# Patient Record
Sex: Female | Born: 1940 | Race: White | Hispanic: No | Smoking: Former smoker
Health system: Southern US, Community
[De-identification: ages and names within clinical notes are randomized; demographics above are authoritative.]

## PROBLEM LIST (undated history)

## (undated) DIAGNOSIS — K219 Gastro-esophageal reflux disease without esophagitis: Secondary | ICD-10-CM

## (undated) DIAGNOSIS — I10 Essential (primary) hypertension: Secondary | ICD-10-CM

## (undated) DIAGNOSIS — E119 Type 2 diabetes mellitus without complications: Secondary | ICD-10-CM

## (undated) DIAGNOSIS — F329 Major depressive disorder, single episode, unspecified: Secondary | ICD-10-CM

## (undated) DIAGNOSIS — F419 Anxiety disorder, unspecified: Secondary | ICD-10-CM

## (undated) DIAGNOSIS — F32A Depression, unspecified: Secondary | ICD-10-CM

## (undated) DIAGNOSIS — E785 Hyperlipidemia, unspecified: Secondary | ICD-10-CM

## (undated) DIAGNOSIS — R32 Unspecified urinary incontinence: Secondary | ICD-10-CM

## (undated) HISTORY — DX: Hyperlipidemia, unspecified: E78.5

## (undated) HISTORY — DX: Unspecified urinary incontinence: R32

## (undated) HISTORY — DX: Essential (primary) hypertension: I10

## (undated) HISTORY — DX: Depression, unspecified: F32.A

## (undated) HISTORY — DX: Anxiety disorder, unspecified: F41.9

## (undated) HISTORY — PX: NECK SURGERY: SHX720

## (undated) HISTORY — DX: Gastro-esophageal reflux disease without esophagitis: K21.9

## (undated) HISTORY — DX: Major depressive disorder, single episode, unspecified: F32.9

## (undated) HISTORY — DX: Type 2 diabetes mellitus without complications: E11.9

---

## 2018-02-14 ENCOUNTER — Ambulatory Visit: Payer: Self-pay | Admitting: Medical

## 2018-02-20 ENCOUNTER — Encounter: Payer: Self-pay | Admitting: Medical

## 2018-02-20 ENCOUNTER — Ambulatory Visit (INDEPENDENT_AMBULATORY_CARE_PROVIDER_SITE_OTHER): Payer: Medicare Other | Admitting: Medical

## 2018-02-20 VITALS — BP 138/52 | HR 91 | Temp 97.6°F | Resp 16 | Ht 61.0 in | Wt 171.2 lb

## 2018-02-20 DIAGNOSIS — E119 Type 2 diabetes mellitus without complications: Secondary | ICD-10-CM

## 2018-02-20 DIAGNOSIS — M797 Fibromyalgia: Secondary | ICD-10-CM

## 2018-02-20 DIAGNOSIS — G8929 Other chronic pain: Secondary | ICD-10-CM

## 2018-02-20 DIAGNOSIS — E785 Hyperlipidemia, unspecified: Secondary | ICD-10-CM

## 2018-02-20 DIAGNOSIS — I10 Essential (primary) hypertension: Secondary | ICD-10-CM

## 2018-02-20 DIAGNOSIS — M25562 Pain in left knee: Secondary | ICD-10-CM

## 2018-02-20 DIAGNOSIS — M545 Low back pain: Secondary | ICD-10-CM

## 2018-02-20 DIAGNOSIS — M25552 Pain in left hip: Secondary | ICD-10-CM | POA: Diagnosis not present

## 2018-02-20 DIAGNOSIS — G629 Polyneuropathy, unspecified: Secondary | ICD-10-CM

## 2018-02-20 DIAGNOSIS — F329 Major depressive disorder, single episode, unspecified: Secondary | ICD-10-CM

## 2018-02-20 DIAGNOSIS — F32A Depression, unspecified: Secondary | ICD-10-CM

## 2018-02-20 DIAGNOSIS — R5383 Other fatigue: Secondary | ICD-10-CM

## 2018-02-20 DIAGNOSIS — M2550 Pain in unspecified joint: Secondary | ICD-10-CM

## 2018-02-20 DIAGNOSIS — F419 Anxiety disorder, unspecified: Secondary | ICD-10-CM

## 2018-02-20 MED ORDER — GABAPENTIN 800 MG PO TABS: 800.0000 mg | ORAL_TABLET | Freq: Every day | ORAL | 3 refills | Status: DC

## 2018-02-20 MED ORDER — CLONAZEPAM 0.5 MG PO TABS: 0.5000 mg | ORAL_TABLET | Freq: Every day | ORAL | 0 refills | Status: DC

## 2018-02-20 NOTE — Patient Instructions (Addendum)
For left knee pain that is chronic will get xray of left knee. Then decide on level of medication needed.  For left hip pain will also get left hip xray. Med that will prescribe to be determined pending xray review.  For low back pain will xray.  For neuropathy, Will prescribe your own gabapentin rx. Stop sister meds.  For depression continue effexor. You have list of specialist. Call and schedule. If you need referral please let us know. If you have severe depression, anxiety, thoughts of harm to self or others then ED evaluation at [facility] long.  For anxiety, klonopin rx for 2 weeks. Discussed control medication policy and rules. For neuropathy continue gabapentin. I rx 800 mg to take at night.  bp controlled. Continue current bp med.  For high cholesterol continue current stain.  Flor diabetes continue metformin,  For fatigue and chronic med probems will get listed labs on this sheet.  Follow up in 2 weeks or as needed.  Sign release form so we can get old records.

## 2018-02-20 NOTE — Progress Notes (Signed)
Subjective:    Patient ID: Crystal Mckenzie, female    DOB: 12-21-1940, 78 y.o.   MRN: [id number]  HPI Pt in today for follow up.  She states has new insurance so needs to get new pcp.  Pt used to smoke. She stopped smoking around 78 yrs old. Started around 2 packs a day. No alcohol. Pt Jehovah witness.  Pt states today her left knee is hurting.  This is chronic pain.  Pt has known neuropathy of lower ext. She in on high dose gabapentin. Some pain that is in hip and runs down her leg often. No xray of left hip in the past. Patient had been on gabapentin in the past and she instead uses higher dose of 800 mg at night. Her old rx was 300 mg tid.  Pt is on effexor for depression. Pt is on effexor for a couple of years. Pt has some different meds in past but she not sure which ones. Pt also has some anxiety. She feels like may need to see psychiatrist at that timestates ready to go see specialist). Pt states middle son overdosed in past. Oldest son passed away from throat cancer. Sister has liver cancer.   Pt states occasionally has old clonazepam rx from sister. Pt has been taking klonopin 0.5 mg nightly.  Pt has hx of htn. Bp is 138/82.  Pt has diabetes type II. She is on metformin.  She also has high cholesterol and is on atorvastatin.     Review of Systems  Constitutional: Negative for chills, fatigue and fever.  Eyes: Negative for pain.  Respiratory: Negative for cough, chest tightness, shortness of breath and wheezing.   Cardiovascular: Negative for chest pain and palpitations.  Gastrointestinal: Negative for abdominal pain, blood in stool, constipation, diarrhea and nausea.  Genitourinary: Negative for dysuria, enuresis, frequency and hematuria.  Musculoskeletal: Positive for back pain. Negative for gait problem.       Knee pain and hip pain. Fibromyalgia as well.  Skin: Negative for rash.  Neurological: Negative for dizziness, syncope, speech difficulty, weakness,  light-headedness and headaches.       Neuropathy.  Hematological: Negative for adenopathy. Does not bruise/bleed easily.  Psychiatric/Behavioral: Positive for dysphoric mood. Negative for behavioral problems, confusion, sleep disturbance and suicidal ideas. The patient is nervous/anxious.     Past Medical History:  Diagnosis Date  . Depression   . Diabetes mellitus without complication (HCC)   . GERD (gastroesophageal reflux disease)   . Hyperlipidemia   . Hypertension   . Urine incontinence      Social History   Socioeconomic History  . Marital status: Widowed    Spouse name: Not on file  . Number of children: Not on file  . Years of education: Not on file  . Highest education level: Not on file  Occupational History  . Not on file  Social Needs  . Financial resource strain: Not on file  . Food insecurity:    Worry: Not on file    Inability: Not on file  . Transportation needs:    Medical: Not on file    Non-medical: Not on file  Tobacco Use  . Smoking status: Not on file  Substance and Sexual Activity  . Alcohol use: Not on file  . Drug use: Not on file  . Sexual activity: Not on file  Lifestyle  . Physical activity:    Days per week: Not on file    Minutes per session: Not on file  .  Stress: Not on file  Relationships  . Social connections:    Talks on phone: Not on file    Gets together: Not on file    Attends religious service: Not on file    Active member of club or organization: Not on file    Attends meetings of clubs or organizations: Not on file    Relationship status: Not on file  . Intimate partner violence:    Fear of current or ex partner: Not on file    Emotionally abused: Not on file    Physically abused: Not on file    Forced sexual activity: Not on file  Other Topics Concern  . Not on file  Social History Narrative  . Not on file     No family history on file.  Allergies  Allergen Reactions  . Penicillins     Current Outpatient  Medications on File Prior to Visit  Medication Sig Dispense Refill  . atorvastatin (LIPITOR) 40 MG tablet Take 40 mg by mouth daily.    . Cholecalciferol (D3 VITAMIN PO) Take 2,000 Units by mouth.    . clonazePAM (KLONOPIN) 0.5 MG tablet Take 0.5 mg by mouth daily.    Marland Kitchen. gabapentin (NEURONTIN) 300 MG capsule Take 300 mg by mouth 3 (three) times daily.    Marland Kitchen. gabapentin (NEURONTIN) 800 MG tablet Take 800 mg by mouth at bedtime.    Marland Kitchen. lisinopril (PRINIVIL,ZESTRIL) 20 MG tablet Take 20 mg by mouth daily.    . Magnesium Citrate 125 MG CAPS Take by mouth.    . metFORMIN (GLUCOPHAGE) 500 MG tablet Take by mouth 3 (three) times daily.    . metoprolol tartrate (LOPRESSOR) 50 MG tablet Take 50 mg by mouth 2 (two) times daily.    Marland Kitchen. omeprazole (PRILOSEC) 40 MG capsule Take 40 mg by mouth daily.    Marland Kitchen. venlafaxine XR (EFFEXOR-XR) 150 MG 24 hr capsule Take 300 mg by mouth daily with breakfast.     No current facility-administered medications on file prior to visit.     BP (!) 138/52   Pulse 91   Temp 97.6 F (36.4 C) (Oral)   Resp 16   Ht 5\' 1"  (1.549 m)   Wt 171 lb 3.2 oz (77.7 kg)   SpO2 97%   BMI 32.35 kg/m       Objective:   Physical Exam  General Mental Status- Alert. General Appearance- Not in acute distress.   Skin General: Color- Normal Color. Moisture- Normal Moisture.  Neck Carotid Arteries- Normal color. Moisture- Normal Moisture. No carotid bruits. No JVD.  Chest and Lung Exam Auscultation: Breath Sounds:-Normal.  Cardiovascular Auscultation:Rythm- Regular. Murmurs & Other Heart Sounds:Auscultation of the heart reveals- No Murmurs.  Abdomen Inspection:-Inspeection Normal. Palpation/Percussion:Note:No mass. Palpation and Percussion of the abdomen reveal- Non Tender, Non Distended + BS, no rebound or guarding.   Neurologic Cranial Nerve exam:- CN III-XII intact(No nystagmus), symmetric smile. Strength:- 5/5 equal and symmetric strength both upper and lower  extremities.  Left hip- pain on palpation and rom.      Assessment & Plan:   For left knee pain that is chronic will get xray of left knee. Then decide on level of medication needed.  For left hip pain will also get left hip xray. Med that will prescribe to be determined pending xray review.  For back pain will get xray.  For neuropathy, Will prescribe your own gabapentin rx. Stop sister meds.  For depression continue effexor. You have list of specialist.  Call and schedule. If you need referral please let us know. If you have severe depression, anxiety, thoughts of harm to self or others then ED evaluation at [facility] long.  For anxiety, klonopin rx for 2 weeks. Discussed control medication policy and rules. For neuropathy continue gabapentin. I rx 800 mg to take at night.  bp controlled. Continue current bp med.  For high cholesterol continue current stain.  Flor diabetes continue metformin,  For fatigue and chronic med probems will get listed labs on this sheet.  Follow up in 2 weeks or as needed.  Sign release form so we can get old records.  45 minuts spent with pt 50% of time spent reviewing and explaining/counseling pt on each chronic condition and plan going forward.

## 2018-02-21 ENCOUNTER — Telehealth: Payer: Self-pay | Admitting: Medical

## 2018-02-21 ENCOUNTER — Other Ambulatory Visit (INDEPENDENT_AMBULATORY_CARE_PROVIDER_SITE_OTHER): Payer: Medicare Other

## 2018-02-21 ENCOUNTER — Ambulatory Visit (HOSPITAL_BASED_OUTPATIENT_CLINIC_OR_DEPARTMENT_OTHER)
Admission: RE | Admit: 2018-02-21 | Discharge: 2018-02-21 | Disposition: A | Discharge status: Home / Self Care | Payer: Medicare Other | Source: Ambulatory Visit | Attending: Medical | Admitting: Medical

## 2018-02-21 DIAGNOSIS — I10 Essential (primary) hypertension: Secondary | ICD-10-CM | POA: Diagnosis not present

## 2018-02-21 DIAGNOSIS — E119 Type 2 diabetes mellitus without complications: Secondary | ICD-10-CM

## 2018-02-21 DIAGNOSIS — R5383 Other fatigue: Secondary | ICD-10-CM

## 2018-02-21 DIAGNOSIS — M25552 Pain in left hip: Secondary | ICD-10-CM | POA: Diagnosis present

## 2018-02-21 DIAGNOSIS — M25562 Pain in left knee: Principal | ICD-10-CM

## 2018-02-21 DIAGNOSIS — M545 Low back pain, unspecified: Secondary | ICD-10-CM

## 2018-02-21 DIAGNOSIS — G8929 Other chronic pain: Secondary | ICD-10-CM | POA: Diagnosis present

## 2018-02-21 DIAGNOSIS — E785 Hyperlipidemia, unspecified: Secondary | ICD-10-CM

## 2018-02-21 DIAGNOSIS — M2550 Pain in unspecified joint: Secondary | ICD-10-CM | POA: Diagnosis not present

## 2018-02-21 LAB — CBC WITH DIFFERENTIAL/PLATELET
Basophils Absolute: 0 10*3/uL (ref 0.0–0.1)
Basophils Relative: 0.7 % (ref 0.0–3.0)
Eosinophils Absolute: 0.6 10*3/uL (ref 0.0–0.7)
Eosinophils Relative: 9.4 % — ABNORMAL HIGH (ref 0.0–5.0)
HCT: 36.4 % (ref 36.0–46.0)
Hemoglobin: 11.9 g/dL — ABNORMAL LOW (ref 12.0–15.0)
Lymphocytes Relative: 31.9 % (ref 12.0–46.0)
Lymphs Abs: 2 10*3/uL (ref 0.7–4.0)
MCHC: 32.8 g/dL (ref 30.0–36.0)
MCV: 89.1 fl (ref 78.0–100.0)
Monocytes Absolute: 0.5 10*3/uL (ref 0.1–1.0)
Monocytes Relative: 7.5 % (ref 3.0–12.0)
NEUTROS ABS: 3.1 10*3/uL (ref 1.4–7.7)
Neutrophils Relative %: 50.5 % (ref 43.0–77.0)
Platelets: 186 10*3/uL (ref 150.0–400.0)
RBC: 4.08 Mil/uL (ref 3.87–5.11)
RDW: 13.5 % (ref 11.5–15.5)
WBC: 6.2 10*3/uL (ref 4.0–10.5)

## 2018-02-21 LAB — COMPREHENSIVE METABOLIC PANEL
ALT: 14 U/L (ref 0–35)
AST: 17 U/L (ref 0–37)
Albumin: 4.5 g/dL (ref 3.5–5.2)
Alkaline Phosphatase: 79 U/L (ref 39–117)
BUN: 24 mg/dL — ABNORMAL HIGH (ref 6–23)
CO2: 26 mEq/L (ref 19–32)
Calcium: 10.1 mg/dL (ref 8.4–10.5)
Chloride: 102 mEq/L (ref 96–112)
Creatinine, Ser: 1.14 mg/dL (ref 0.40–1.20)
GFR: 48.93 mL/min — AB (ref >60.00)
GLUCOSE: 145 mg/dL — AB (ref 70–99)
Potassium: 4.7 mEq/L (ref 3.5–5.1)
Sodium: 137 mEq/L (ref 135–145)
Total Bilirubin: 0.4 mg/dL (ref 0.2–1.2)
Total Protein: 7.2 g/dL (ref 6.0–8.3)

## 2018-02-21 LAB — HEMOGLOBIN A1C: Hgb A1c MFr Bld: 7.5 % — ABNORMAL HIGH (ref 4.6–6.5)

## 2018-02-21 LAB — LIPID PANEL
Cholesterol: 186 mg/dL (ref 0–200)
HDL: 45.6 mg/dL (ref >39.00)
LDL Cholesterol: 103 mg/dL — ABNORMAL HIGH (ref 0–99)
NonHDL: 140.77
Total CHOL/HDL Ratio: 4
Triglycerides: 191 mg/dL — ABNORMAL HIGH (ref 0.0–149.0)
VLDL: 38.2 mg/dL (ref 0.0–40.0)

## 2018-02-21 LAB — C-REACTIVE PROTEIN: CRP: 0.2 mg/dL — ABNORMAL LOW (ref 0.5–20.0)

## 2018-02-21 LAB — VITAMIN B12: Vitamin B-12: 299 pg/mL (ref 211–911)

## 2018-02-21 LAB — TSH: TSH: 1.8 u[IU]/mL (ref 0.35–4.50)

## 2018-02-21 LAB — SEDIMENTATION RATE: SED RATE: 16 mm/h (ref 0–30)

## 2018-02-21 NOTE — Telephone Encounter (Signed)
Referral to orthopedist placed. 

## 2018-02-23 LAB — ANA: Anti Nuclear Antibody(ANA): POSITIVE — AB

## 2018-02-23 LAB — ANTI-NUCLEAR AB-TITER (ANA TITER): ANA Titer 1: 1:320 {titer} — ABNORMAL HIGH

## 2018-02-23 LAB — VITAMIN B1: VITAMIN B1 (THIAMINE): 9 nmol/L (ref 8–30)

## 2018-02-23 LAB — RHEUMATOID FACTOR: Rheumatoid fact SerPl-aCnc: <14 IU/mL (ref <14)

## 2018-03-01 ENCOUNTER — Ambulatory Visit (HOSPITAL_COMMUNITY): Payer: Self-pay | Admitting: Psychiatry

## 2018-03-06 ENCOUNTER — Telehealth: Payer: Self-pay

## 2018-03-06 NOTE — Telephone Encounter (Signed)
2 weeks since I have last seen her. She needs office visit for acute sick visit complaint. Can't give advise without seeing her.  Thanks

## 2018-03-06 NOTE — Telephone Encounter (Signed)
Pt called 6:13am states she has COPD thinks she has a could but now is having vomiting and wheezing using albuterol inhaler cold symptoms are getting worse. Please advise.

## 2018-03-07 ENCOUNTER — Ambulatory Visit: Payer: Self-pay | Admitting: Medical

## 2018-03-07 NOTE — Telephone Encounter (Signed)
Left message for pt to call back. Okay for [facility] to give information.  

## 2018-03-08 ENCOUNTER — Encounter: Payer: Self-pay | Admitting: Medical

## 2018-03-08 ENCOUNTER — Telehealth: Payer: Self-pay | Admitting: Medical

## 2018-03-08 ENCOUNTER — Ambulatory Visit: Payer: Self-pay | Admitting: Medical

## 2018-03-08 NOTE — Telephone Encounter (Signed)
I have seen her one time and just checked care eveyrwhere and no information found.   If admitted she should follow up in office. Was she already discharged ? Not sure why no info in care everywhere. Please encourage to follow up.  Without getting records hard to give any specific advise.

## 2018-03-08 NOTE — Telephone Encounter (Signed)
Copied from CRM [id number]. Topic: Quick Communication - See Telephone Encounter >> Mar 08, 2018 11:26 AM Arlyss Gandy, NT wrote: CRM for notification. See Telephone encounter for: 03/08/18. Pt had to cancel her appt today due to being admitted to the [facility] for RSV. She states she was dx with an immune deficiency disorder and wants to speak with a provider regarding to get more info about this as she believes it may be why she got sick. Please advise.

## 2018-03-12 ENCOUNTER — Other Ambulatory Visit: Payer: Self-pay | Admitting: Medical

## 2018-03-12 MED ORDER — LISINOPRIL 20 MG PO TABS: 20.0000 mg | ORAL_TABLET | Freq: Every day | ORAL | 1 refills | Status: DC

## 2018-03-12 MED ORDER — METOPROLOL TARTRATE 50 MG PO TABS: 50.0000 mg | ORAL_TABLET | Freq: Two times a day (BID) | ORAL | 1 refills | Status: DC

## 2018-03-12 NOTE — Telephone Encounter (Signed)
Requested medication (s) are due for refill today: yes  Requested medication (s) are on the active medication list: yes  Last refill:  Previously ordered by historical provider  Future visit scheduled: yes, 03/14/18  Notes to clinic:  Medication previously ordered by historical provider    Requested Prescriptions  Pending Prescriptions Disp Refills   metoprolol tartrate (LOPRESSOR) 50 MG tablet      Sig: Take 1 tablet (50 mg total) by mouth 2 (two) times daily.     Cardiovascular:  Beta Blockers Passed - 03/12/2018 11:49 AM      Passed - Last BP in normal range    BP Readings from Last 1 Encounters:  02/20/18 (!) 138/52         Passed - Last Heart Rate in normal range    Pulse Readings from Last 1 Encounters:  02/20/18 91         Passed - Valid encounter within last 6 months    Recent Outpatient Visits          2 weeks ago Chronic pain of left knee   Holiday representative at [employer], [place], [employer]            In 2 days Saguier, Ramon Dredge, PA-C [employer] at [employer], [facility]          lisinopril (PRINIVIL,ZESTRIL) 20 MG tablet      Sig: Take 1 tablet (20 mg total) by mouth daily.     Cardiovascular:  ACE Inhibitors Passed - 03/12/2018 11:49 AM      Passed - Cr in normal range and within 180 days    Creatinine, Ser  Date Value Ref Range Status  02/21/2018 1.14 0.40 - 1.20 mg/dL Final         Passed - K in normal range and within 180 days    Potassium  Date Value Ref Range Status  02/21/2018 4.7 3.5 - 5.1 mEq/L Final         Passed - Patient is not pregnant      Passed - Last BP in normal range    BP Readings from Last 1 Encounters:  02/20/18 (!) 138/52         Passed - Valid encounter within last 6 months    Recent Outpatient Visits          2 weeks ago Chronic pain of left knee   Holiday representative at [employer], [place], [employer]            In 2 days Saguier, [employer], PA-C [employer] at [employer], [facility]

## 2018-03-12 NOTE — Telephone Encounter (Signed)
Copied from CRM [id number]. Topic: Quick Communication - Rx Refill/Question >> Mar 12, 2018 10:15 AM Jens Som A wrote: Medication: metoprolol tartrate (LOPRESSOR) 50 MG tablet [[id number]] , lisinopril (PRINIVIL,ZESTRIL) 20 MG tablet [[id number]]   Has the patient contacted their pharmacy? Yes  (Agent: If no, request that the patient contact the pharmacy for the refill.) (Agent: If yes, when and what did the pharmacy advise?)  Preferred Pharmacy (with phone number or street name): [id number] - [place], [place] - [address] AT [facility] [address] [phone] (Phone) [phone] (Fax)    Agent: Please be advised that RX refills may take up to 3 business days. We ask that you follow-up with your pharmacy.

## 2018-03-13 NOTE — Telephone Encounter (Signed)
Pt scheduled for follow up 03/14/18.

## 2018-03-14 ENCOUNTER — Ambulatory Visit (HOSPITAL_BASED_OUTPATIENT_CLINIC_OR_DEPARTMENT_OTHER)
Admission: RE | Admit: 2018-03-14 | Discharge: 2018-03-14 | Disposition: A | Discharge status: Home / Self Care | Payer: Medicare Other | Source: Ambulatory Visit | Attending: Medical | Admitting: Medical

## 2018-03-14 ENCOUNTER — Ambulatory Visit (INDEPENDENT_AMBULATORY_CARE_PROVIDER_SITE_OTHER): Payer: Medicare Other | Admitting: Medical

## 2018-03-14 ENCOUNTER — Encounter: Payer: Self-pay | Admitting: Medical

## 2018-03-14 VITALS — BP 140/64 | HR 94 | Temp 97.6°F | Resp 16 | Ht 61.0 in | Wt 163.0 lb

## 2018-03-14 DIAGNOSIS — J449 Chronic obstructive pulmonary disease, unspecified: Secondary | ICD-10-CM | POA: Diagnosis present

## 2018-03-14 DIAGNOSIS — F419 Anxiety disorder, unspecified: Secondary | ICD-10-CM

## 2018-03-14 DIAGNOSIS — R768 Other specified abnormal immunological findings in serum: Secondary | ICD-10-CM | POA: Diagnosis not present

## 2018-03-14 DIAGNOSIS — F329 Major depressive disorder, single episode, unspecified: Secondary | ICD-10-CM

## 2018-03-14 DIAGNOSIS — R062 Wheezing: Secondary | ICD-10-CM | POA: Insufficient documentation

## 2018-03-14 DIAGNOSIS — M2550 Pain in unspecified joint: Secondary | ICD-10-CM | POA: Diagnosis not present

## 2018-03-14 DIAGNOSIS — R05 Cough: Secondary | ICD-10-CM

## 2018-03-14 DIAGNOSIS — R059 Cough, unspecified: Secondary | ICD-10-CM

## 2018-03-14 DIAGNOSIS — Z79899 Other long term (current) drug therapy: Secondary | ICD-10-CM

## 2018-03-14 DIAGNOSIS — F32A Depression, unspecified: Secondary | ICD-10-CM

## 2018-03-14 MED ORDER — METHYLPREDNISOLONE ACETATE 40 MG/ML IJ SUSP
40.0000 mg | Freq: Once | INTRAMUSCULAR | Status: AC
  Administered 2018-03-14: 40 mg via INTRAMUSCULAR

## 2018-03-14 MED ORDER — BUDESONIDE-FORMOTEROL FUMARATE 80-4.5 MCG/ACT IN AERO: INHALATION_SPRAY | RESPIRATORY_TRACT | 3 refills | Status: DC

## 2018-03-14 MED ORDER — CLONAZEPAM 0.5 MG PO TABS: 0.5000 mg | ORAL_TABLET | Freq: Every day | ORAL | 0 refills | Status: DC

## 2018-03-14 MED ORDER — ALBUTEROL SULFATE HFA 108 (90 BASE) MCG/ACT IN AERS: 2.0000 | INHALATION_SPRAY | Freq: Four times a day (QID) | RESPIRATORY_TRACT | 2 refills | Status: DC | PRN

## 2018-03-14 NOTE — Progress Notes (Signed)
Subjective:    Patient ID: Crystal LatheGloria Mckenzie, female    DOB: Feb 14, 1940, 78 y.o.   MRN: [id number]  HPI  Pt in for follow up.  Pt was admitted to ED.   [facility] the [facility] note reads.  On March 06, 2017  patient is a 78 year old female with a past medical history that is significant for hypertension, anemia, anxiety, peripheral neuropathy, type 2 diabetes, depression presenting to the [facility] with complaints of productive cough, shortness of breath, congestion, nausea and vomiting as well. Patient has been feeling generalized malaise and body aches. Patient was certain that she might of had influenza even though she is already had flu shot she states. Given these concerns patient presented to be ED where her pulse oximetry was noted to be 84% on room air.  Pt states she was dx with RSV. She was admitted and then discharge after about 2 nights.   [facility] showed no PE's.   At time of dc pt oxygen was 94%  On discharge appears she was given 5 mg prednisone 12 day dose pack.   Pt also has 40 year history of smoking. Years ago one of former provider told her she had copd.  Since discharge she states wheezing some at night but not much during the day.  History of anxiety and depression. She is on effexor and clonazepam. She was using just clonazepam just once at night.(uses for insomnia and anxiety). Last visit rx'd bid but she used it just at night.   Pt had ana + on labs.various joint pain, hx of fatigue and occasional faint rash on her face.   Review of Systems  Constitutional: Negative for chills, fatigue and fever.  HENT: Negative for congestion, ear pain and rhinorrhea.   Respiratory: Positive for cough, shortness of breath and wheezing. Negative for choking and chest tightness.   Cardiovascular: Negative for chest pain and palpitations.  Musculoskeletal: Negative for back pain and myalgias.  Skin: Negative for rash.  Neurological: Negative for dizziness  and headaches.  Hematological: Negative for adenopathy. Does not bruise/bleed easily.  Psychiatric/Behavioral: Positive for sleep disturbance. Negative for behavioral problems, confusion, hallucinations and suicidal ideas. The patient is nervous/anxious.     Past Medical History:  Diagnosis Date  . Anxiety   . Depression   . Diabetes (HCC)   . Diabetes mellitus without complication (HCC)   . GERD (gastroesophageal reflux disease)   . Hyperlipidemia   . Hypertension   . Urine incontinence      Social History   Socioeconomic History  . Marital status: Widowed    Spouse name: Not on file  . Number of children: Not on file  . Years of education: Not on file  . Highest education level: Not on file  Occupational History  . Not on file  Social Needs  . Financial resource strain: Not on file  . Food insecurity:    Worry: Not on file    Inability: Not on file  . Transportation needs:    Medical: Not on file    Non-medical: Not on file  Tobacco Use  . Smoking status: Former Smoker    Packs/day: 2.00    Years: 15.00    Pack years: 30.00    Types: Cigarettes    Last attempt to quit: 02/21/1987    Years since quitting: 31.0  . Smokeless tobacco: Never Used  Substance and Sexual Activity  . Alcohol use: Not Currently    Frequency: Never  .  Drug use: Never  . Sexual activity: Not Currently  Lifestyle  . Physical activity:    Days per week: Not on file    Minutes per session: Not on file  . Stress: Not on file  Relationships  . Social connections:    Talks on phone: Not on file    Gets together: Not on file    Attends religious service: Not on file    Active member of club or organization: Not on file    Attends meetings of clubs or organizations: Not on file    Relationship status: Not on file  . Intimate partner violence:    Fear of current or ex partner: Not on file    Emotionally abused: Not on file    Physically abused: Not on file    Forced sexual activity: Not  on file  Other Topics Concern  . Not on file  Social History Narrative  . Not on file    Past Surgical History:  Procedure Laterality Date  . NECK SURGERY      No family history on file.  Allergies  Allergen Reactions  . Penicillins     Current Outpatient Medications on File Prior to Visit  Medication Sig Dispense Refill  . atorvastatin (LIPITOR) 40 MG tablet Take 40 mg by mouth daily.    . Cholecalciferol (D3 VITAMIN PO) Take 2,000 Units by mouth.    . clonazePAM (KLONOPIN) 0.5 MG tablet Take 1 tablet (0.5 mg total) by mouth at bedtime. 15 tablet 0  . gabapentin (NEURONTIN) 800 MG tablet Take 800 mg by mouth at bedtime.    . gabapentin (NEURONTIN) 800 MG tablet Take 1 tablet (800 mg total) by mouth at bedtime. 30 tablet 3  . lisinopril (PRINIVIL,ZESTRIL) 20 MG tablet TAKE 1 TABLET(20 MG) BY MOUTH DAILY 90 tablet 0  . Magnesium Citrate 125 MG CAPS Take by mouth.    . metFORMIN (GLUCOPHAGE) 500 MG tablet Take by mouth 3 (three) times daily.    . metoprolol tartrate (LOPRESSOR) 50 MG tablet TAKE 1 TABLET(50 MG) BY MOUTH TWICE DAILY 180 tablet 0  . omeprazole (PRILOSEC) 40 MG capsule Take 40 mg by mouth daily.    Marland Kitchen. venlafaxine XR (EFFEXOR-XR) 150 MG 24 hr capsule Take 300 mg by mouth daily with breakfast.     No current facility-administered medications on file prior to visit.     BP 140/64   Pulse 94   Temp 97.6 F (36.4 C)   Resp 16   Ht 5\' 1"  (1.549 m)   Wt 163 lb (73.9 kg)   SpO2 99%   BMI 30.80 kg/m       Objective:   Physical Exam  General  Mental Status - Alert. General Appearance - Well groomed. Not in acute distress.  Skin Rashes- No Rashes.  HEENT Head- Normal. Ear Auditory Canal - Left- Normal. Right - Normal.Tympanic Membrane- Left- Normal. Right- Normal. Eye Sclera/Conjunctiva- Left- Normal. Right- Normal. Nose & Sinuses Nasal Mucosa- Left-  Boggy and Congested. Right-  Boggy and  Congested.Bilateral maxillary and frontal sinus  pressure. Mouth & Throat Lips: Upper Lip- Normal: no dryness, cracking, pallor, cyanosis, or vesicular eruption. Lower Lip-Normal: no dryness, cracking, pallor, cyanosis or vesicular eruption. Buccal Mucosa- Bilateral- No Aphthous ulcers. Oropharynx- No Discharge or Erythema. Tonsils: Characteristics- Bilateral- No Erythema or Congestion. Size/Enlargement- Bilateral- No enlargement. Discharge- bilateral-None.  Neck Neck- Supple. No Masses.   Chest and Lung Exam Auscultation: Breath Sounds:-Clear even and unlabored.  Cardiovascular Auscultation:Rythm- Regular,  rate and rhythm. Murmurs & Other Heart Sounds:Ausculatation of the heart reveal- No Murmurs.  Lymphatic Head & Neck General Head & Neck Lymphatics: Bilateral: Description- No Localized lymphadenopathy.  Lower ext- No pedal edema.Negative homans signs.     Assessment & Plan:  You do appear to be much improved compared to description and our computer system when you were evaluated at [facility].  You do have good oxygen saturation today at 99%.  With your history of COPD, recent RSV and persistent mild wheezing, we did give you Depo-Medrol 40 mg IM injection today.  Continue with remaining days of your 5 days of prednisone.  Did go ahead and prescribe Symbicort inhaler to use 2 inhalations twice daily.  Also prescription of albuterol inhaler to use if needed for severe wheezing/shortness of breath.  Will get a chest x-ray today.  For history of arthralgias, fatigue and recent positive ANA, I did go ahead and put in referral to a rheumatolog ist.  For history of anxiety and depression, you will continue with Effexor and clonazepam.  Prescribing clonazepam to use at night for anxiety/insomnia.  30 tablet prescription given.  Getting you to sign controlled medication contract today and give UDS.  Follow-up in 3 weeks or as needed.  40 minutes spent with pt. 50% of time spent cousneling on treatment plan going forward for copd,  anxiety & depression, and +ANA.  Esperanza Richters, PA-C  Esperanza Richters, PA-C

## 2018-03-14 NOTE — Patient Instructions (Addendum)
You do appear to be much improved compared to description and our computer system when you were evaluated at [facility].  You do have good oxygen saturation today at 99%.  With your history of COPD, recent RSV and persistent mild wheezing, we did give you Depo-Medrol 40 mg IM injection today.  Continue with remaining days of your 5 days of prednisone.  Did go ahead and prescribe Symbicort inhaler to use 2 inhalations twice daily.  Also prescription of albuterol inhaler to use if needed for severe wheezing/shortness of breath.  Will get a chest x-ray today.  For history of arthralgias, fatigue and recent positive ANA, I did go ahead and put in referral to a rheumatolog ist.  For history of anxiety and depression, you will continue with Effexor and clonazepam.  Prescribing clonazepam to use at night for anxiety/insomnia.  30 tablet prescription given.  Getting you to sign controlled medication contract today and give UDS.  Follow-up in 3 weeks or as needed.

## 2018-03-17 LAB — PAIN MGMT, PROFILE 8 W/CONF, U
6 Acetylmorphine: NEGATIVE ng/mL (ref <10)
Alcohol Metabolites: NEGATIVE ng/mL (ref <500)
Alphahydroxyalprazolam: NEGATIVE ng/mL (ref <25)
Alphahydroxymidazolam: NEGATIVE ng/mL (ref <50)
Alphahydroxytriazolam: NEGATIVE ng/mL (ref <50)
Aminoclonazepam: 105 ng/mL — ABNORMAL HIGH (ref <25)
Amphetamines: NEGATIVE ng/mL (ref <500)
BENZODIAZEPINES: POSITIVE ng/mL — AB (ref <100)
Buprenorphine, Urine: NEGATIVE ng/mL (ref <5)
Cocaine Metabolite: NEGATIVE ng/mL (ref <150)
Creatinine: 100.2 mg/dL
Hydroxyethylflurazepam: NEGATIVE ng/mL (ref <50)
Lorazepam: 412 ng/mL — ABNORMAL HIGH (ref <50)
MDMA: NEGATIVE ng/mL (ref <500)
Marijuana Metabolite: NEGATIVE ng/mL (ref <20)
Nordiazepam: NEGATIVE ng/mL (ref <50)
OXIDANT: NEGATIVE ug/mL (ref <200)
Opiates: NEGATIVE ng/mL (ref <100)
Oxazepam: NEGATIVE ng/mL (ref <50)
Oxycodone: NEGATIVE ng/mL (ref <100)
Temazepam: NEGATIVE ng/mL (ref <50)
pH: 5.11 (ref 4.5–9.0)

## 2018-03-20 ENCOUNTER — Other Ambulatory Visit: Payer: Self-pay

## 2018-03-20 MED ORDER — ALBUTEROL SULFATE HFA 108 (90 BASE) MCG/ACT IN AERS: 2.0000 | INHALATION_SPRAY | Freq: Four times a day (QID) | RESPIRATORY_TRACT | 5 refills | Status: DC | PRN

## 2018-03-28 ENCOUNTER — Ambulatory Visit: Payer: Self-pay | Admitting: Medical

## 2018-04-06 ENCOUNTER — Ambulatory Visit: Payer: Self-pay | Admitting: Medical

## 2018-04-06 DIAGNOSIS — E1142 Type 2 diabetes mellitus with diabetic polyneuropathy: Secondary | ICD-10-CM | POA: Insufficient documentation

## 2018-04-06 DIAGNOSIS — E119 Type 2 diabetes mellitus without complications: Secondary | ICD-10-CM | POA: Insufficient documentation

## 2018-04-12 ENCOUNTER — Institutional Professional Consult (permissible substitution): Payer: Self-pay | Admitting: Pulmonary Disease

## 2018-04-16 ENCOUNTER — Institutional Professional Consult (permissible substitution): Payer: Self-pay | Admitting: Pulmonary Disease

## 2018-04-16 NOTE — Progress Notes (Deleted)
Synopsis: Referred in March 2020 for COPD  Subjective:   PATIENT ID: Crystal Mckenzie GENDER: female DOB: Jun 18, 1940, MRN: [id number]   HPI  No chief complaint on file.   ***  Past Medical History:  Diagnosis Date  . Anxiety   . Depression   . Diabetes (HCC)   . Diabetes mellitus without complication (HCC)   . GERD (gastroesophageal reflux disease)   . Hyperlipidemia   . Hypertension   . Urine incontinence      No family history on file.   Social History   Socioeconomic History  . Marital status: Widowed    Spouse name: Not on file  . Number of children: Not on file  . Years of education: Not on file  . Highest education level: Not on file  Occupational History  . Not on file  Social Needs  . Financial resource strain: Not on file  . Food insecurity:    Worry: Not on file    Inability: Not on file  . Transportation needs:    Medical: Not on file    Non-medical: Not on file  Tobacco Use  . Smoking status: Former Smoker    Packs/day: 2.00    Years: 15.00    Pack years: 30.00    Types: Cigarettes    Last attempt to quit: 02/21/1987    Years since quitting: 31.1  . Smokeless tobacco: Never Used  Substance and Sexual Activity  . Alcohol use: Not Currently    Frequency: Never  . Drug use: Never  . Sexual activity: Not Currently  Lifestyle  . Physical activity:    Days per week: Not on file    Minutes per session: Not on file  . Stress: Not on file  Relationships  . Social connections:    Talks on phone: Not on file    Gets together: Not on file    Attends religious service: Not on file    Active member of club or organization: Not on file    Attends meetings of clubs or organizations: Not on file    Relationship status: Not on file  . Intimate partner violence:    Fear of current or ex partner: Not on file    Emotionally abused: Not on file    Physically abused: Not on file    Forced sexual activity: Not on file  Other Topics Concern  . Not on  file  Social History Narrative  . Not on file     Allergies  Allergen Reactions  . Penicillins      Outpatient Medications Prior to Visit  Medication Sig Dispense Refill  . albuterol (PROAIR HFA) 108 (90 Base) MCG/ACT inhaler Inhale 2 puffs into the lungs every 6 (six) hours as needed for wheezing or shortness of breath. 18 g 5  . atorvastatin (LIPITOR) 40 MG tablet Take 40 mg by mouth daily.    . budesonide-formoterol (SYMBICORT) 80-4.5 MCG/ACT inhaler 2 inhalation twice daily 1 Inhaler 3  . Cholecalciferol (D3 VITAMIN PO) Take 2,000 Units by mouth.    . clonazePAM (KLONOPIN) 0.5 MG tablet Take 1 tablet (0.5 mg total) by mouth at bedtime. 30 tablet 0  . gabapentin (NEURONTIN) 800 MG tablet Take 800 mg by mouth at bedtime.    . gabapentin (NEURONTIN) 800 MG tablet Take 1 tablet (800 mg total) by mouth at bedtime. 30 tablet 3  . lisinopril (PRINIVIL,ZESTRIL) 20 MG tablet TAKE 1 TABLET(20 MG) BY MOUTH DAILY 90 tablet 0  . Magnesium Citrate  125 MG CAPS Take by mouth.    . metFORMIN (GLUCOPHAGE) 500 MG tablet Take by mouth 3 (three) times daily.    . metoprolol tartrate (LOPRESSOR) 50 MG tablet TAKE 1 TABLET(50 MG) BY MOUTH TWICE DAILY 180 tablet 0  . omeprazole (PRILOSEC) 40 MG capsule Take 40 mg by mouth daily.    Marland Kitchen venlafaxine XR (EFFEXOR-XR) 150 MG 24 hr capsule Take 300 mg by mouth daily with breakfast.     No facility-administered medications prior to visit.     ROS    Objective:  Physical Exam   There were no vitals filed for this visit.  ***  CBC    Component Value Date/Time   WBC 6.2 02/21/2018 1030   RBC 4.08 02/21/2018 1030   HGB 11.9 (L) 02/21/2018 1030   HCT 36.4 02/21/2018 1030   PLT 186.0 02/21/2018 1030   MCV 89.1 02/21/2018 1030   MCHC 32.8 02/21/2018 1030   RDW 13.5 02/21/2018 1030   LYMPHSABS 2.0 02/21/2018 1030   MONOABS 0.5 02/21/2018 1030   EOSABS 0.6 02/21/2018 1030   BASOSABS 0.0 02/21/2018 1030     Chest imaging: 03/2018 CXR images  personally reviewed showing chronic bronchitis changes, cervical spine hardware  PFT:  Labs:  Path:  Echo:  Heart Catheterization:  Hospital records from January 2020 from [facility] reviewed where she was admitted for acute on chronic respiratory failure with hypoxemia in the setting of a respiratory viral infection.     Assessment & Plan:   No diagnosis found.  Discussion: ***    Current Outpatient Medications:  .  albuterol (PROAIR HFA) 108 (90 Base) MCG/ACT inhaler, Inhale 2 puffs into the lungs every 6 (six) hours as needed for wheezing or shortness of breath., Disp: 18 g, Rfl: 5 .  atorvastatin (LIPITOR) 40 MG tablet, Take 40 mg by mouth daily., Disp: , Rfl:  .  budesonide-formoterol (SYMBICORT) 80-4.5 MCG/ACT inhaler, 2 inhalation twice daily, Disp: 1 Inhaler, Rfl: 3 .  Cholecalciferol (D3 VITAMIN PO), Take 2,000 Units by mouth., Disp: , Rfl:  .  clonazePAM (KLONOPIN) 0.5 MG tablet, Take 1 tablet (0.5 mg total) by mouth at bedtime., Disp: 30 tablet, Rfl: 0 .  gabapentin (NEURONTIN) 800 MG tablet, Take 800 mg by mouth at bedtime., Disp: , Rfl:  .  gabapentin (NEURONTIN) 800 MG tablet, Take 1 tablet (800 mg total) by mouth at bedtime., Disp: 30 tablet, Rfl: 3 .  lisinopril (PRINIVIL,ZESTRIL) 20 MG tablet, TAKE 1 TABLET(20 MG) BY MOUTH DAILY, Disp: 90 tablet, Rfl: 0 .  Magnesium Citrate 125 MG CAPS, Take by mouth., Disp: , Rfl:  .  metFORMIN (GLUCOPHAGE) 500 MG tablet, Take by mouth 3 (three) times daily., Disp: , Rfl:  .  metoprolol tartrate (LOPRESSOR) 50 MG tablet, TAKE 1 TABLET(50 MG) BY MOUTH TWICE DAILY, Disp: 180 tablet, Rfl: 0 .  omeprazole (PRILOSEC) 40 MG capsule, Take 40 mg by mouth daily., Disp: , Rfl:  .  venlafaxine XR (EFFEXOR-XR) 150 MG 24 hr capsule, Take 300 mg by mouth daily with breakfast., Disp: , Rfl:

## 2018-04-17 ENCOUNTER — Other Ambulatory Visit: Payer: Self-pay | Admitting: Medical

## 2018-04-18 NOTE — Telephone Encounter (Signed)
Rx clonazepam sent to pt pharmacy. 

## 2018-04-18 NOTE — Telephone Encounter (Signed)
Refill Request: Clonazepam   Last RX:03/14/18 Last OV: 03/14/18 Next OV: None scheduled  UDS: 03/14/18 CSC:03/14/18 CSR:

## 2018-04-23 ENCOUNTER — Ambulatory Visit: Payer: Self-pay | Admitting: Medical

## 2018-05-07 ENCOUNTER — Telehealth: Payer: Self-pay | Admitting: Medical

## 2018-05-07 NOTE — Telephone Encounter (Signed)
Spoke with pt to reschedule her appt from 04-23-2018 and informed pt about [facility], pt wants to download app first before making her appt. Pt will call to reschedule.

## 2018-06-09 ENCOUNTER — Other Ambulatory Visit: Payer: Self-pay | Admitting: Medical

## 2018-06-11 NOTE — Telephone Encounter (Signed)
Pt requesting refill on effexor last PCP gave pt  3 month supply now pt is out. Please advise

## 2018-06-12 MED ORDER — VENLAFAXINE HCL ER 150 MG PO CP24: 300.0000 mg | ORAL_CAPSULE | Freq: Every day | ORAL | 0 refills | Status: DC

## 2018-06-12 NOTE — Telephone Encounter (Signed)
Appt already scheduled for tomorrow

## 2018-06-12 NOTE — Telephone Encounter (Signed)
I sent in prescription of pt effexor. She is new pt. I saw her in February. Asked her to follow up in 3 weeks. She did not follow up. I want her be scheduled for virtual visit. Let me know if she is willing to be scheduled or not. Also make sure she check blood pressure on day of virtual visit.

## 2018-06-13 ENCOUNTER — Ambulatory Visit: Payer: Medicare Other | Admitting: Medical

## 2018-06-13 ENCOUNTER — Other Ambulatory Visit: Payer: Self-pay

## 2018-06-14 ENCOUNTER — Other Ambulatory Visit: Payer: Self-pay

## 2018-06-14 ENCOUNTER — Encounter: Payer: Self-pay | Admitting: Medical

## 2018-06-14 ENCOUNTER — Ambulatory Visit (INDEPENDENT_AMBULATORY_CARE_PROVIDER_SITE_OTHER): Payer: Medicare Other | Admitting: Medical

## 2018-06-14 DIAGNOSIS — J449 Chronic obstructive pulmonary disease, unspecified: Secondary | ICD-10-CM | POA: Diagnosis not present

## 2018-06-14 DIAGNOSIS — F419 Anxiety disorder, unspecified: Secondary | ICD-10-CM

## 2018-06-14 DIAGNOSIS — F329 Major depressive disorder, single episode, unspecified: Secondary | ICD-10-CM

## 2018-06-14 DIAGNOSIS — R109 Unspecified abdominal pain: Secondary | ICD-10-CM

## 2018-06-14 DIAGNOSIS — F32A Depression, unspecified: Secondary | ICD-10-CM

## 2018-06-14 DIAGNOSIS — R5383 Other fatigue: Secondary | ICD-10-CM

## 2018-06-14 DIAGNOSIS — E119 Type 2 diabetes mellitus without complications: Secondary | ICD-10-CM

## 2018-06-14 DIAGNOSIS — K589 Irritable bowel syndrome without diarrhea: Secondary | ICD-10-CM

## 2018-06-14 NOTE — Patient Instructions (Addendum)
Patient has history of anxiety and recently some worsening with sheltering in place/self-isolation.  She does not have a whole lot of social interaction at all except for occasional phone calls.  She states that her normal 0.5 mg dose of clonazepam not controlling her anxiety.  Her mood is a little bit depressed but she is not reporting any thoughts of harm to self or others.  She is on high-dose Effexor.  I advised patient that she can increase her clonazepam to 1 to 2 tablets a day.  I think this is reasonable in light of current circumstances.  Presently I am hesitant to add other medication to her depression regimen as she is on the already very high dose of Effexor.  Concern for serotonin syndrome if forward add medication.  So I did encourage patient to get more social interaction even if this through video services such as FaceTime or sky.  Also encouraged her to get some more exercise daily but this would be dependent on upcoming labs and EKG.  She also reports some recent fatigue and is moderate to severe.  She is staying in bed more than usual.  Sleeping a blood bit more excessively as well.  I explained to patient that we would need an office visit tomorrow morning at that time we will get labs to include CBC, CMP, iron, B12 and a TSH.  She also has some recent abdomen pain intermittently.  She states that she has had abdomen pain for years and it usually occurs after eating.  She does admit history of reflux and was told she had IBS in the past as well.  She has history of loose stools after eating in the past and most recently as well.  No fevers reported.  She does report some occasional nausea but no vomiting.  But this nausea is a part of her normal pattern for patient.  After in office visit tomorrow a.m. we will probably add H2 blocker famotidine to her omeprazole regimen and probably prescribe bentyl   Patient does have diabetes and is on metformin.  Her sugars have not been controlled as  well as in the past.  Her most blood sugar level was 289.  She thinks that she might be occasionally running low sugars but no sugars reported less than 70. Want to get a1c tomorrow. May add onglyza to her regimen but want to assess a1c first.  She has a history of COPD and only has occasional wheezing.  None recently.  She does have Symbicort available.  She reports no cough and no fever.  She did mention some occasional transient left sided pectoralis region pain.  This occurs when she stretches and moves.  Feels like there is a cramp present.  She states that she will resume her normal position and the cramping sensation goes away.  She doubts that this is cardiac pain.  She states EKGs have been normal in the past.  She reports no jaw pain, left shoulder pain, shortness of breath or sweating with this pain. Will need to get ekg tomorrow and perform physical exam. Advised pt that if pain returns/severe then be seen at ED. Explained benefit vs risk presently of going to ED during pandemic but our ED at [facility] recently slow.  Follow up tomorrow 8:40 am office visit due to necesity as explained above.

## 2018-06-14 NOTE — Progress Notes (Signed)
Subjective:    Patient ID: Crystal Mckenzie, female    DOB: [birth year], 78 y.o.   MRN: [id number]  HPI  Virtual Visit via Telephone Note  I connected with Crystal Mckenzie on 06/14/18 at 11:40 AM EDT by telephone and verified that I am speaking with the correct person using two identifiers.  Location: Patient: home Provider: home  She did not check her vitals today.  Visit done initially virtual/telpehone due to viral pandemic.   I discussed the limitations, risks, security and privacy concerns of performing an evaluation and management service by telephone and the availability of in person appointments. I also discussed with the patient that there may be a patient responsible charge related to this service. The patient expressed understanding and agreed to proceed.   History of Present Illness:  Pt states she is not feeling well with her mood. She states feels more stressed staying indoors. Lost her sister months ago and sad since she feels isolated. Pt states sleeping more. Pt is on high dose effexor. 300 mg. She also is using clonazepam 0.5 mg at night. One tab not helping with recent anxiety. Pt is attending some on line church office. Pt not getting exercise. She has dogs.  Pt also states her sugars are not well controlled. She is getting some nausea at times and ha. Pt states her highest sugar level was 289 recently.   Pt is also reporting some abdomen pain. But she does report history of stomach pain for years. She states pain after eating and diarrhea. She describes symptoms more like ibs. Admits diagnosis of this past. Also has hx of gerd and hiatal hernia.  States when she leans over and stretches arm he leftr pectoral muscle feels like will cramp. I wanted her to come in office tomorrow for office visit. Not my day to come in office but will come to office. Pt expresses reluctance to come in for personal visit. But after explaining with various problems including left side  pectoral/chest wall pain then she did agree. Pt states pain is intermittnet and not now. No associated cardiac type signs and symptoms.  Overall feeling fatigued.  Occasional wheezing when exposed to pollen. She is on symbicort and albuterol.   Observations/Objective: NA since on phone visit.  Assessment and Plan: She also has some recent abdomen pain intermittently.  She states that she has had abdomen pain for years and it usually occurs after eating.  She does admit history of reflux and was told she had IBS in the past as well.  She has history of loose stools after eating in the past and most recently as well.  No fevers reported.  She does report some occasional nausea but no vomiting.  But this nausea is a part of her normal pattern for patient.  After in office visit tomorrow a.m. we will probably add H2 blocker famotidine to her omeprazole regimen and probably prescribe bentyl   Patient does have diabetes and is on metformin.  Her sugars have not been controlled as well as in the past.  Her most blood sugar level was 289.  She thinks that she might be occasionally running low sugars but no sugars reported less than 70. Want to get a1c tomorrow. May add onglyza to her regimen but want to assess a1c first.  She has a history of COPD and only has occasional wheezing.  None recently.  She does have Symbicort available.  She reports no cough and no fever.  She did  mention some occasional transient left sided pectoralis region pain.  This occurs when she stretches and moves.  Feels like there is a cramp present.  She states that she will resume her normal position and the cramping sensation goes away.  She doubts that this is cardiac pain.  She states EKGs have been normal in the past.  She reports no jaw pain, left shoulder pain, shortness of breath or sweating with this pain. Will need to get ekg tomorrow and perform physical exam. Advised pt that if pain returns/severe then be seen at ED.  Explained benefit vs risk presently of going to ED during pandemic but [facility].  Follow up tomorrow 8:40 am office visit due to necesity as explained above.  Follow Up Instructions:    I discussed the assessment and treatment plan with the patient. The patient was provided an opportunity to ask questions and all were answered. The patient agreed with the plan and demonstrated an understanding of the instructions.   The patient was advised to call back or seek an in-person evaluation if the symptoms worsen or if the condition fails to improve as anticipated.  25 minutes spent with pt. 50% of time spent counseling with her regarding why need for in office visit tomorrow as well as assessing current state of her conditions to assure stability. Explained if chest pain were to worsen during interim then ED visit would be necessary.   Esperanza RichtersEdward Vane Yapp, PA-C    Review of Systems  Constitutional: Positive for fatigue. Negative for chills and fever.  HENT: Negative for congestion, ear pain, nosebleeds, postnasal drip, sinus pressure and sinus pain.   Respiratory: Negative for cough and shortness of breath.   Cardiovascular: Negative for chest pain and palpitations.  Gastrointestinal: Positive for abdominal pain, diarrhea and nausea.  Musculoskeletal: Negative for arthralgias, back pain, joint swelling and neck stiffness.       Left pectoral pain when strectching for things using left upper ext.  Skin: Negative for rash.  Neurological: Negative for dizziness, seizures, light-headedness and headaches.  Hematological: Negative for adenopathy. Does not bruise/bleed easily.  Psychiatric/Behavioral: Positive for dysphoric mood. Negative for agitation, behavioral problems, decreased concentration, sleep disturbance and suicidal ideas. The patient is nervous/anxious.        Objective:   Physical Exam        Assessment & Plan:

## 2018-06-15 ENCOUNTER — Ambulatory Visit (HOSPITAL_BASED_OUTPATIENT_CLINIC_OR_DEPARTMENT_OTHER)
Admission: RE | Admit: 2018-06-15 | Discharge: 2018-06-15 | Disposition: A | Discharge status: Home / Self Care | Payer: Medicare Other | Source: Ambulatory Visit | Attending: Medical | Admitting: Medical

## 2018-06-15 ENCOUNTER — Ambulatory Visit (INDEPENDENT_AMBULATORY_CARE_PROVIDER_SITE_OTHER): Payer: Medicare Other | Admitting: Medical

## 2018-06-15 ENCOUNTER — Encounter: Payer: Self-pay | Admitting: Medical

## 2018-06-15 ENCOUNTER — Other Ambulatory Visit: Payer: Self-pay

## 2018-06-15 VITALS — BP 130/70 | HR 72 | Temp 98.1°F | Resp 16 | Ht 61.0 in | Wt 161.4 lb

## 2018-06-15 DIAGNOSIS — R0789 Other chest pain: Secondary | ICD-10-CM

## 2018-06-15 DIAGNOSIS — R109 Unspecified abdominal pain: Secondary | ICD-10-CM | POA: Diagnosis not present

## 2018-06-15 DIAGNOSIS — E119 Type 2 diabetes mellitus without complications: Secondary | ICD-10-CM | POA: Diagnosis not present

## 2018-06-15 DIAGNOSIS — R0781 Pleurodynia: Secondary | ICD-10-CM

## 2018-06-15 DIAGNOSIS — F419 Anxiety disorder, unspecified: Secondary | ICD-10-CM

## 2018-06-15 DIAGNOSIS — R5383 Other fatigue: Secondary | ICD-10-CM | POA: Diagnosis not present

## 2018-06-15 LAB — COMPREHENSIVE METABOLIC PANEL
ALT: 16 U/L (ref 0–35)
AST: 20 U/L (ref 0–37)
Albumin: 4.6 g/dL (ref 3.5–5.2)
Alkaline Phosphatase: 78 U/L (ref 39–117)
BUN: 28 mg/dL — ABNORMAL HIGH (ref 6–23)
CO2: 27 mEq/L (ref 19–32)
Calcium: 10.1 mg/dL (ref 8.4–10.5)
Chloride: 101 mEq/L (ref 96–112)
Creatinine, Ser: 1.53 mg/dL — ABNORMAL HIGH (ref 0.40–1.20)
GFR: 32.76 mL/min — ABNORMAL LOW (ref >60.00)
Glucose, Bld: 152 mg/dL — ABNORMAL HIGH (ref 70–99)
Potassium: 4.6 mEq/L (ref 3.5–5.1)
Sodium: 140 mEq/L (ref 135–145)
Total Bilirubin: 0.5 mg/dL (ref 0.2–1.2)
Total Protein: 7.8 g/dL (ref 6.0–8.3)

## 2018-06-15 LAB — TROPONIN I: TNIDX: 0.01 ug/l (ref 0.00–0.06)

## 2018-06-15 LAB — CBC WITH DIFFERENTIAL/PLATELET
Basophils Absolute: 0 10*3/uL (ref 0.0–0.1)
Basophils Relative: 0.5 % (ref 0.0–3.0)
Eosinophils Absolute: 0.3 10*3/uL (ref 0.0–0.7)
Eosinophils Relative: 4.3 % (ref 0.0–5.0)
HCT: 38.5 % (ref 36.0–46.0)
Hemoglobin: 12.8 g/dL (ref 12.0–15.0)
Lymphocytes Relative: 25.9 % (ref 12.0–46.0)
Lymphs Abs: 2 10*3/uL (ref 0.7–4.0)
MCHC: 33.1 g/dL (ref 30.0–36.0)
MCV: 91.2 fl (ref 78.0–100.0)
Monocytes Absolute: 0.7 10*3/uL (ref 0.1–1.0)
Monocytes Relative: 9.5 % (ref 3.0–12.0)
Neutro Abs: 4.5 10*3/uL (ref 1.4–7.7)
Neutrophils Relative %: 59.8 % (ref 43.0–77.0)
Platelets: 200 10*3/uL (ref 150.0–400.0)
RBC: 4.22 Mil/uL (ref 3.87–5.11)
RDW: 14 % (ref 11.5–15.5)
WBC: 7.6 10*3/uL (ref 4.0–10.5)

## 2018-06-15 LAB — IRON: Iron: 71 ug/dL (ref 42–145)

## 2018-06-15 LAB — VITAMIN B12: Vitamin B-12: 256 pg/mL (ref 211–911)

## 2018-06-15 LAB — TSH: TSH: 4.16 u[IU]/mL (ref 0.35–4.50)

## 2018-06-15 LAB — HEMOGLOBIN A1C: Hgb A1c MFr Bld: 7.3 % — ABNORMAL HIGH (ref 4.6–6.5)

## 2018-06-15 MED ORDER — ONDANSETRON 4 MG PO TBDP: 4.0000 mg | ORAL_TABLET | Freq: Three times a day (TID) | ORAL | 0 refills | Status: DC | PRN

## 2018-06-15 MED ORDER — DICYCLOMINE HCL 10 MG PO CAPS: 10.0000 mg | ORAL_CAPSULE | Freq: Three times a day (TID) | ORAL | 1 refills | Status: DC

## 2018-06-15 MED ORDER — FAMOTIDINE 20 MG PO TABS: 20.0000 mg | ORAL_TABLET | Freq: Every day | ORAL | 3 refills | Status: DC

## 2018-06-15 NOTE — Patient Instructions (Addendum)
For anxiety and mood, pick up the effexor rx. Also can increase clonazepam to 1-2 tab a day.  Fatigue moderate to severe.  She is staying in bed more than usual.  Sleeping a blood bit more excessively as well.  I explained to patient that we would need an office visit tomorrow morning at that time we will get labs to include CBC, CMP, iron, B12 and a TSH.   Some recent abdomen pain intermittently.  She states that she has had abdomen pain for years and it usually occurs after eating.  She does admit history of reflux and was told she had IBS in the past as well.  She has history of loose stools after eating in the past and most recently as well.  No fevers reported.  She does report some occasional nausea but no vomiting.  But this nausea is a part of her normal pattern for patient. Add H2 blocker famotidine to her omeprazole regimen and probably prescribe bentyl   Diabetes and is on metformin.  Her sugars have not been controlled as well as in the past.  Her most blood sugar level was 289.  She thinks that she might be occasionally running low sugars but no sugars reported less than 70. Want to get a1c tomorrow. May add onglyza to her regimen but want to assess a1c first.  History of COPD and only has occasional wheezing.  None recently.  She does have Symbicort available.  She reports no cough and no fever.  For atypical rib area pain we got ekg which was normal sinus rhythm. Will get xray and stat troponin for caution sake. Advised try salon pas lidocaine patch to area and can try low dose ibuprofen. If troponin positive will advise ED evaluation. If pain worsens or changes also recommend ED evaluation.  Follow up date to be determined.

## 2018-06-15 NOTE — Progress Notes (Signed)
Subjective:    Patient ID: Crystal LatheGloria Mckenzie, female    DOB: 08-Jul-1940, 78 y.o.   MRN: [id number]  HPI  Pt here today for ekg. She had some atypical pectoralis pain vs chest pain. She states like pec muscle cramp. See yesterday note some feature like muscle cramp. Pain more atypical and not classic cardiac. So want her in today to palpate muscle and to review ekg. Pt ekg shows normal sinus rhythm.   She continue to think pain muscle cramp. Last time had was 2 days ago. She had pain 2 days in a row. She states hx of some calf cramps at time in past. She thinks her k might be low.  Pt does not both times her cramp sensation occurred on reaching for something. She actually points to left lower rib area and serratus anterior region.     Review of Systems  Constitutional: Positive for fatigue. Negative for chills and fever.       See yesterday ros.  HENT: Negative for congestion and ear discharge.   Respiratory: Negative for cough, chest tightness, shortness of breath and wheezing.   Cardiovascular: Negative for palpitations.       Not suspcious for cardiac. More rib pain and serratus anterior region pain on stretching arm.  Gastrointestinal: Positive for abdominal pain. Negative for blood in stool and diarrhea.  Musculoskeletal: Negative for back pain.       Rib pain.   Skin: Negative for rash.  Neurological: Negative for dizziness, speech difficulty, weakness, numbness and headaches.  Hematological: Negative for adenopathy. Does not bruise/bleed easily.  Psychiatric/Behavioral: Negative for decreased concentration and dysphoric mood. The patient is nervous/anxious.        Objective:   Physical Exam  General Mental Status- Alert. General Appearance- Not in acute distress.   Skin General: Color- Normal Color. Moisture- Normal Moisture.  Neck Carotid Arteries- Normal color. Moisture- Normal Moisture. No carotid bruits. No JVD.  Chest and Lung Exam Auscultation: Breath  Sounds:-Normal.  Cardiovascular Auscultation:Rythm- Regular. Murmurs & Other Heart Sounds:Auscultation of the heart reveals- No Murmurs.  Abdomen Inspection:-Inspeection Normal. Palpation/Percussion:Note:No mass. Palpation and Percussion of the abdomen reveal- Non Tender, Non Distended + BS, no rebound or guarding.    Neurologic Cranial Nerve exam:- CN III-XII intact(No nystagmus) Strength:- 5/5 equal and symmetric strength both upper and lower extremities.  Anterior chest- no left rib or serratus anterior area pain. But this is area where she got pain 2 days ago.      Assessment & Plan:  For anxiety and mood, pick up the effexor rx. Also can increase clonazepam to 1-2 tab a day.  Fatigue moderate to severe.  She is staying in bed more than usual.  Sleeping a blood bit more excessively as well.  I explained to patient that we would need an office visit tomorrow morning at that time we will get labs to include CBC, CMP, iron, B12 and a TSH.  Some recent abdomen pain intermittently.  She states that she has had abdomen pain for years and it usually occurs after eating.  She does admit history of reflux and was told she had IBS in the past as well.  She has history of loose stools after eating in the past and most recently as well.  No fevers reported.  She does report some occasional nausea but no vomiting.  But this nausea is a part of her normal pattern for patient. Add H2 blocker famotidine to her omeprazole regimen and probably prescribe bentyl  Diabetes and is on metformin.  Her sugars have not been controlled as well as in the past.  Her most blood sugar level was 289.  She thinks that she might be occasionally running low sugars but no sugars reported less than 70. Want to get a1c tomorrow. May add onglyza to her regimen but want to assess a1c first.  History of COPD and only has occasional wheezing.  None recently.  She does have Symbicort available.  She reports no cough and no  fever.  For atypical rib area pain we got ekg which was normal sinus rhythm. Will get xray and stat troponin for caution sake. Advised try salon pas lidocaine patch to area and can try low dose ibuprofen. If troponin positive will advise ED evaluation. If pain worsens or changes also recommend ED evaluation.  Follow up date to be determined.

## 2018-06-19 ENCOUNTER — Other Ambulatory Visit: Payer: Self-pay | Admitting: Medical

## 2018-06-24 ENCOUNTER — Other Ambulatory Visit: Payer: Self-pay | Admitting: Medical

## 2018-07-30 ENCOUNTER — Ambulatory Visit (INDEPENDENT_AMBULATORY_CARE_PROVIDER_SITE_OTHER): Payer: Medicare Other | Admitting: Family

## 2018-07-30 ENCOUNTER — Encounter: Payer: Self-pay | Admitting: Family

## 2018-07-30 ENCOUNTER — Other Ambulatory Visit: Payer: Self-pay

## 2018-07-30 DIAGNOSIS — M545 Low back pain, unspecified: Secondary | ICD-10-CM

## 2018-07-30 DIAGNOSIS — N289 Disorder of kidney and ureter, unspecified: Secondary | ICD-10-CM

## 2018-07-30 DIAGNOSIS — I10 Essential (primary) hypertension: Secondary | ICD-10-CM

## 2018-07-30 DIAGNOSIS — R110 Nausea: Secondary | ICD-10-CM

## 2018-07-30 DIAGNOSIS — R350 Frequency of micturition: Secondary | ICD-10-CM | POA: Diagnosis not present

## 2018-07-30 NOTE — Progress Notes (Addendum)
Virtual Visit via telephone Note  I connected with Crystal Mckenzie on 07/30/18 at 12:20 PM EDT by a video enabled telemedicine application and verified that I am speaking with the correct person using two identifiers.  Location: Patient: home Provider: home   I discussed the limitations of evaluation and management by telemedicine and the availability of in person appointments. The patient expressed understanding and agreed to proceed.  History of Present Illness:  Patient is a 78 yr old female who presents today with chief complaint of back pain.  Reports that pain started about 5 days ago.  She does report that she did have a fall about 2 weeks ago.  She reports that she was sleeping that she was sweeping the porch and "just fell backwards."  She reports that she may have felt a little light headed.  She reports that she hit the coffee table and ended up on her back again. Notes that she had some bruising around her stomach area. Notes that she was very sore for a while.   She reports "a lot of nausea" the last few days.  She has been using zofran several times over the last few days.  She reports that she has IBS which causes her some gi discomfort.  GI discomfort is at baseline.   Reports +urinary frequency though this is not unusual for her. She denies any dysuria.   Yesterday had severe nausea as well as pain across the lower back. Reports that she ate breakfast and drank a soda today and has kept them down  107/57 hf 86   BP Readings from Last 3 Encounters:  06/15/18 130/70  03/14/18 140/64  02/20/18 (!) 138/52   Reports blood sugar has been "all over the place." has had a low of 60 and a high in the 300's.   Renal insufficiency- reports no NSAIDS in the last 2 months. She does take tylenol PM.     Past Medical History:  Diagnosis Date  . Anxiety   . Depression   . Diabetes (HCC)   . Diabetes mellitus without complication (HCC)   . GERD (gastroesophageal reflux disease)    . Hyperlipidemia   . Hypertension   . Urine incontinence      Social History   Socioeconomic History  . Marital status: Widowed    Spouse name: Not on file  . Number of children: Not on file  . Years of education: Not on file  . Highest education level: Not on file  Occupational History  . Not on file  Social Needs  . Financial resource strain: Not on file  . Food insecurity    Worry: Not on file    Inability: Not on file  . Transportation needs    Medical: Not on file    Non-medical: Not on file  Tobacco Use  . Smoking status: Former Smoker    Packs/day: 2.00    Years: 15.00    Pack years: 30.00    Types: Cigarettes    Quit date: 02/21/1987    Years since quitting: 31.4  . Smokeless tobacco: Never Used  Substance and Sexual Activity  . Alcohol use: Not Currently    Frequency: Never  . Drug use: Never  . Sexual activity: Not Currently  Lifestyle  . Physical activity    Days per week: Not on file    Minutes per session: Not on file  . Stress: Not on file  Relationships  . Social Musicianconnections    Talks on  phone: Not on file    Gets together: Not on file    Attends religious service: Not on file    Active member of club or organization: Not on file    Attends meetings of clubs or organizations: Not on file    Relationship status: Not on file  . Intimate partner violence    Fear of current or ex partner: Not on file    Emotionally abused: Not on file    Physically abused: Not on file    Forced sexual activity: Not on file  Other Topics Concern  . Not on file  Social History Narrative  . Not on file    Past Surgical History:  Procedure Laterality Date  . NECK SURGERY      No family history on file.  Allergies  Allergen Reactions  . Penicillins     Current Outpatient Medications on File Prior to Visit  Medication Sig Dispense Refill  . albuterol (PROAIR HFA) 108 (90 Base) MCG/ACT inhaler Inhale 2 puffs into the lungs every 6 (six) hours as needed  for wheezing or shortness of breath. 18 g 5  . atorvastatin (LIPITOR) 40 MG tablet Take 40 mg by mouth daily.    . budesonide-formoterol (SYMBICORT) 80-4.5 MCG/ACT inhaler 2 inhalation twice daily 1 Inhaler 3  . Cholecalciferol (D3 VITAMIN PO) Take 2,000 Units by mouth.    . clonazePAM (KLONOPIN) 0.5 MG tablet TAKE 1 TABLET BY MOUTH AT BEDTIME 30 tablet 2  . dicyclomine (BENTYL) 10 MG capsule Take 1 capsule (10 mg total) by mouth 4 (four) times daily -  before meals and at bedtime. 120 capsule 1  . famotidine (PEPCID) 20 MG tablet Take 1 tablet (20 mg total) by mouth daily. 30 tablet 3  . gabapentin (NEURONTIN) 800 MG tablet Take 800 mg by mouth at bedtime.    . gabapentin (NEURONTIN) 800 MG tablet Take 1 tablet (800 mg total) by mouth at bedtime. 30 tablet 3  . lisinopril (ZESTRIL) 20 MG tablet TAKE 1 TABLET(20 MG) BY MOUTH DAILY 90 tablet 0  . Magnesium Citrate 125 MG CAPS Take by mouth.    . metFORMIN (GLUCOPHAGE) 500 MG tablet Take by mouth 3 (three) times daily.    . metoprolol tartrate (LOPRESSOR) 50 MG tablet TAKE 1 TABLET(50 MG) BY MOUTH TWICE DAILY 180 tablet 0  . omeprazole (PRILOSEC) 40 MG capsule Take 40 mg by mouth daily.    . ondansetron (ZOFRAN ODT) 4 MG disintegrating tablet Take 1 tablet (4 mg total) by mouth every 8 (eight) hours as needed for nausea or vomiting. 20 tablet 0  . venlafaxine XR (EFFEXOR-XR) 150 MG 24 hr capsule Take 2 capsules (300 mg total) by mouth daily. Can cancel the 60 tab rx. Fill 180. 180 capsule 0   No current facility-administered medications on file prior to visit.     There were no vitals taken for this visit.   Observations/Objective:   Gen: Awake, alert, no acute distress Resp: Breathing is even and non-labored Psych: calm/pleasant demeanor Neuro: Alert and Oriented x 3, + facial symmetry, speech is clear.   Assessment and Plan:   HTN- advised pt to cut lisinopril in half and decrease from 20mg  to 10mg  once daily.  I am concerned that  her recent falls are related to hypotension. Plan to follow up in 1  Week with pcp.  Renal insufficiency- noted last visit-  Encouraged hydration.  Recheck cmet.  Urinary frequency- check UA/culture.  Low back pain- obtain x-ray  of lumbar spine to rule out compression fracture.  Pt is advised to go to the ER if she develops increased weakness, abdominal pain, or inability to keep down food/liquid.  Pt verbalizes understanding.   She requests to do lab work tomorrow as well as x-ray. She declines my offer to have her complete today.   22 minutes spent on today's phone call visit with the patient. Follow Up Instructions:    I discussed the assessment and treatment plan with the patient. The patient was provided an opportunity to ask questions and all were answered. The patient agreed with the plan and demonstrated an understanding of the instructions.   The patient was advised to call back or seek an in-person evaluation if the symptoms worsen or if the condition fails to improve as anticipated.  Nance Pear, NP

## 2018-07-31 ENCOUNTER — Other Ambulatory Visit: Payer: Medicare Other

## 2018-08-05 NOTE — Patient Instructions (Signed)
For recent fall bp med dose reduced. Going forward also advised on postural bp changes and get balance before ambulating.  For abdomen pain close to baseline with nausea advised pt to go ahead and get the labs placed on last visit. Continue current meds. Any significant change in abdomen pain then ED evaluation as discussed. For nausea can use zofran.  For frequent urination get urine culture order placed on last visit.  For low back pain hx recommend getting the lumbar  Diabetes discussed today. Will repeat a1c in 2 months. Continue metformin.  Follow up to be determined after lab and xray review.

## 2018-08-05 NOTE — Progress Notes (Signed)
Subjective:    Patient ID: Crystal Mckenzie, female    DOB: 11/22/1940, 78 y.o.   MRN: [id number]  HPI  Pt has in office follow up visit from last week virtual visit. Seen by Efraim KaufmannMelissa NP.  Pt had some back pain. She reported  that pain started after fall about 2 weeks ago.  She reports that she  was sweeping the porch and "just fell backwards."  She reports that she may have felt a little light headed.  She reports that she hit the coffee table and ended up on her back again. Notes that she had some bruising around her stomach area. Notes that she was very sore for a while.   She reported "a lot of nausea" the last few days.  She has been using zofran several times over the last few days.  She reports that she has IBS which causes her some gi discomfort.  GI discomfort is at baseline.  She is on pepcid, bentyl and ppi omeprazole.  Reported +urinary frequency on last visit though this is not unusual for her. She denies any dysuria.   Day before last visit on June 21st  severe nausea as well as pain across the lower back. Reports that she ate breakfast and drank a soda today and has kept them down  Last visit she was advised to half her lisinopril to 10 mg dose since due to concern for hypotension leading to fall.  Melissa advised hydration to help with renal insufficiency as well as labs placed. On review epic she did not get those studies done.  She was advised on indicators/precautions that would require ED evaluation.  Xray of low back ordered. But not done.   Hx of diabetes an last a1c was 7.3.(some varying levels per pt). But not extreme.   Review of Systems  Constitutional: Negative for chills, fatigue and unexpected weight change.  Eyes: Negative for photophobia, pain and visual disturbance.  Respiratory: Negative for choking and chest tightness.   Cardiovascular: Negative for chest pain and palpitations.  Gastrointestinal: Positive for nausea.       On last visit.    Genitourinary: Positive for frequency.       On last visit.  Musculoskeletal: Positive for back pain. Negative for arthralgias, neck pain and neck stiffness.       On last visit.  Skin: Negative for rash.  Neurological: Negative for dizziness, tremors, seizures and weakness.       Prior to fall a week ago.  Psychiatric/Behavioral: Negative for behavioral problems, dysphoric mood, sleep disturbance and suicidal ideas. The patient is not nervous/anxious.     Past Medical History:  Diagnosis Date   Anxiety    Depression    Diabetes (HCC)    Diabetes mellitus without complication (HCC)    GERD (gastroesophageal reflux disease)    Hyperlipidemia    Hypertension    Urine incontinence      Social History   Socioeconomic History   Marital status: Widowed    Spouse name: Not on file   Number of children: Not on file   Years of education: Not on file   Highest education level: Not on file  Occupational History   Not on file  Social Needs   Financial resource strain: Not on file   Food insecurity    Worry: Not on file    Inability: Not on file   Transportation needs    Medical: Not on file    Non-medical: Not on file  Tobacco Use   Smoking status: Former Smoker    Packs/day: 2.00    Years: 15.00    Pack years: 30.00    Types: Cigarettes    Quit date: 02/21/1987    Years since quitting: 31.4   Smokeless tobacco: Never Used  Substance and Sexual Activity   Alcohol use: Not Currently    Frequency: Never   Drug use: Never   Sexual activity: Not Currently  Lifestyle   Physical activity    Days per week: Not on file    Minutes per session: Not on file   Stress: Not on file  Relationships   Social connections    Talks on phone: Not on file    Gets together: Not on file    Attends religious service: Not on file    Active member of club or organization: Not on file    Attends meetings of clubs or organizations: Not on file    Relationship status:  Not on file   Intimate partner violence    Fear of current or ex partner: Not on file    Emotionally abused: Not on file    Physically abused: Not on file    Forced sexual activity: Not on file  Other Topics Concern   Not on file  Social History Narrative   Not on file    Past Surgical History:  Procedure Laterality Date   NECK SURGERY      No family history on file.  Allergies  Allergen Reactions   Penicillins     Current Outpatient Medications on File Prior to Visit  Medication Sig Dispense Refill   albuterol (PROAIR HFA) 108 (90 Base) MCG/ACT inhaler Inhale 2 puffs into the lungs every 6 (six) hours as needed for wheezing or shortness of breath. 18 g 5   atorvastatin (LIPITOR) 40 MG tablet Take 40 mg by mouth daily.     budesonide-formoterol (SYMBICORT) 80-4.5 MCG/ACT inhaler 2 inhalation twice daily 1 Inhaler 3   Cholecalciferol (D3 VITAMIN PO) Take 2,000 Units by mouth.     clonazePAM (KLONOPIN) 0.5 MG tablet TAKE 1 TABLET BY MOUTH AT BEDTIME 30 tablet 2   dicyclomine (BENTYL) 10 MG capsule Take 1 capsule (10 mg total) by mouth 4 (four) times daily -  before meals and at bedtime. 120 capsule 1   famotidine (PEPCID) 20 MG tablet Take 1 tablet (20 mg total) by mouth daily. 30 tablet 3   gabapentin (NEURONTIN) 800 MG tablet Take 800 mg by mouth at bedtime.     gabapentin (NEURONTIN) 800 MG tablet Take 1 tablet (800 mg total) by mouth at bedtime. 30 tablet 3   lisinopril (ZESTRIL) 20 MG tablet TAKE 1 TABLET(20 MG) BY MOUTH DAILY 90 tablet 0   Magnesium Citrate 125 MG CAPS Take by mouth.     metFORMIN (GLUCOPHAGE) 500 MG tablet Take by mouth 3 (three) times daily.     metoprolol tartrate (LOPRESSOR) 50 MG tablet TAKE 1 TABLET(50 MG) BY MOUTH TWICE DAILY 180 tablet 0   omeprazole (PRILOSEC) 40 MG capsule Take 40 mg by mouth daily.     ondansetron (ZOFRAN ODT) 4 MG disintegrating tablet Take 1 tablet (4 mg total) by mouth every 8 (eight) hours as needed for  nausea or vomiting. 20 tablet 0   venlafaxine XR (EFFEXOR-XR) 150 MG 24 hr capsule Take 2 capsules (300 mg total) by mouth daily. Can cancel the 60 tab rx. Fill 180. 180 capsule 0   No current facility-administered medications on  file prior to visit.     There were no vitals taken for this visit.      Objective:   Physical Exam  General Mental Status- Alert. General Appearance- Not in acute distress.   Skin General: Color- Normal Color. Moisture- Normal Moisture.  Neck Carotid Arteries- Normal color. Moisture- Normal Moisture. No carotid bruits. No JVD.  Chest and Lung Exam Auscultation: Breath Sounds:-Normal.  Cardiovascular Auscultation:Rythm- Regular. Murmurs & Other Heart Sounds:Auscultation of the heart reveals- No Murmurs.  Abdomen Inspection:-Inspeection Normal. Palpation/Percussion:Note:No mass. Palpation and Percussion of the abdomen reveal- Non Tender, Non Distended + BS, no rebound or guarding.   Neurologic Cranial Nerve exam:- CN III-XII intact(No nystagmus), symmetric smile. Drift Test:- No drift. Romberg Exam:- Negative.  Heal to Toe Gait exam:-Normal. Finger to Nose:- Normal/Intact Strength:- 5/5 equal and symmetric strength both upper and lower extremities.      Assessment & Plan:  For recent fall bp med dose reduced. Going forward also advised on postural bp changes and get balance before ambulating.  For abdomen pain close to baseline with nausea advised pt to go ahead and get the labs placed on last visit. Continue current meds. Any significant change in abdomen pain then ED evaluation as discussed. For nausea can use zofran.  For frequent urination get urine culture order placed on last visit.  For low back pain hx recommend getting the lumbar  Diabetes discussed today. Will repeat a1c in 2 months. Continue metformin.  Follow up to be determined after lab and xray review.   Mackie Pai, PA-C

## 2018-08-06 ENCOUNTER — Ambulatory Visit: Payer: Medicare Other | Admitting: Medical

## 2018-08-06 ENCOUNTER — Other Ambulatory Visit: Payer: Self-pay | Admitting: Medical

## 2018-08-17 ENCOUNTER — Telehealth: Payer: Self-pay

## 2018-08-17 NOTE — Telephone Encounter (Signed)
Copied from [place] [id number]. Topic: General - Inquiry >> Aug 17, 2018  8:15 AM Virl Axe D wrote: Reason for CRM: Pt called to inquire about having labs and xray done that she declined at her last appt. Her back pain has come back. No answer on scheduling line. Please return call to schedule labs.

## 2018-08-20 ENCOUNTER — Ambulatory Visit: Payer: Medicare Other | Admitting: Medical

## 2018-08-20 ENCOUNTER — Other Ambulatory Visit: Payer: Self-pay

## 2018-08-20 ENCOUNTER — Ambulatory Visit (INDEPENDENT_AMBULATORY_CARE_PROVIDER_SITE_OTHER): Payer: Medicare Other | Admitting: Medical

## 2018-08-20 ENCOUNTER — Encounter: Payer: Self-pay | Admitting: Medical

## 2018-08-20 ENCOUNTER — Ambulatory Visit (HOSPITAL_BASED_OUTPATIENT_CLINIC_OR_DEPARTMENT_OTHER)
Admission: RE | Admit: 2018-08-20 | Discharge: 2018-08-20 | Disposition: A | Discharge status: Home / Self Care | Payer: Medicare Other | Source: Ambulatory Visit | Attending: Family | Admitting: Family

## 2018-08-20 ENCOUNTER — Other Ambulatory Visit: Payer: Self-pay | Admitting: Medical

## 2018-08-20 DIAGNOSIS — R110 Nausea: Secondary | ICD-10-CM

## 2018-08-20 DIAGNOSIS — R350 Frequency of micturition: Secondary | ICD-10-CM | POA: Diagnosis not present

## 2018-08-20 DIAGNOSIS — M545 Low back pain, unspecified: Secondary | ICD-10-CM

## 2018-08-20 DIAGNOSIS — N289 Disorder of kidney and ureter, unspecified: Secondary | ICD-10-CM | POA: Diagnosis not present

## 2018-08-20 LAB — URINALYSIS, ROUTINE W REFLEX MICROSCOPIC
Bilirubin Urine: NEGATIVE
Hgb urine dipstick: NEGATIVE
Ketones, ur: NEGATIVE
Nitrite: NEGATIVE
Specific Gravity, Urine: >=1.03 — AB (ref 1.000–1.030)
Total Protein, Urine: NEGATIVE
Urine Glucose: NEGATIVE
Urobilinogen, UA: 0.2 (ref 0.0–1.0)
pH: 5.5 (ref 5.0–8.0)

## 2018-08-20 LAB — COMPREHENSIVE METABOLIC PANEL
ALT: 11 U/L (ref 0–35)
AST: 15 U/L (ref 0–37)
Albumin: 4.9 g/dL (ref 3.5–5.2)
Alkaline Phosphatase: 91 U/L (ref 39–117)
BUN: 34 mg/dL — ABNORMAL HIGH (ref 6–23)
CO2: 24 mEq/L (ref 19–32)
Calcium: 10.1 mg/dL (ref 8.4–10.5)
Chloride: 100 mEq/L (ref 96–112)
Creatinine, Ser: 1.21 mg/dL — ABNORMAL HIGH (ref 0.40–1.20)
GFR: 42.93 mL/min — ABNORMAL LOW (ref >60.00)
Glucose, Bld: 114 mg/dL — ABNORMAL HIGH (ref 70–99)
Potassium: 5.1 mEq/L (ref 3.5–5.1)
Sodium: 136 mEq/L (ref 135–145)
Total Bilirubin: 0.4 mg/dL (ref 0.2–1.2)
Total Protein: 7.6 g/dL (ref 6.0–8.3)

## 2018-08-20 LAB — CBC WITH DIFFERENTIAL/PLATELET
Basophils Absolute: 0.1 10*3/uL (ref 0.0–0.1)
Basophils Relative: 0.7 % (ref 0.0–3.0)
Eosinophils Absolute: 0.4 10*3/uL (ref 0.0–0.7)
Eosinophils Relative: 5.1 % — ABNORMAL HIGH (ref 0.0–5.0)
HCT: 38.3 % (ref 36.0–46.0)
Hemoglobin: 12.4 g/dL (ref 12.0–15.0)
Lymphocytes Relative: 27.4 % (ref 12.0–46.0)
Lymphs Abs: 2.3 10*3/uL (ref 0.7–4.0)
MCHC: 32.3 g/dL (ref 30.0–36.0)
MCV: 91 fl (ref 78.0–100.0)
Monocytes Absolute: 0.8 10*3/uL (ref 0.1–1.0)
Monocytes Relative: 9.4 % (ref 3.0–12.0)
Neutro Abs: 4.9 10*3/uL (ref 1.4–7.7)
Neutrophils Relative %: 57.4 % (ref 43.0–77.0)
Platelets: 215 10*3/uL (ref 150.0–400.0)
RBC: 4.2 Mil/uL (ref 3.87–5.11)
RDW: 13.9 % (ref 11.5–15.5)
WBC: 8.5 10*3/uL (ref 4.0–10.5)

## 2018-08-20 LAB — LIPASE: Lipase: 71 U/L — ABNORMAL HIGH (ref 11.0–59.0)

## 2018-08-20 MED ORDER — LEVOCETIRIZINE DIHYDROCHLORIDE 5 MG PO TABS: 5.0000 mg | ORAL_TABLET | Freq: Every evening | ORAL | 0 refills | Status: DC

## 2018-08-20 MED ORDER — ONDANSETRON 4 MG PO TBDP: 4.0000 mg | ORAL_TABLET | Freq: Three times a day (TID) | ORAL | 0 refills | Status: DC | PRN

## 2018-08-20 NOTE — Patient Instructions (Addendum)
For recent fall bp med dose reduced. Going forward also advised on postural bp changes and get balance before ambulating. BP is high today. But she stopped taking lisinopril all together. Get back lisinopril 10 mg daily. Check bp daily and call us in one week.  For abdomen pain close to baseline with nausea advised pt to go ahead and get the labs placed on last visit. Continue current meds. Any significant change in abdomen pain then ED evaluation as discussed. For nausea can use zofran.  For frequent urination get urine culture order placed on last visit.  For low back pain hx recommend getting the lumbar xray, use tylenol otc and lidocaine patch otc.  Diabetes discussed today. Will repeat a1c in 2 months. Continue metformin.  For allergic rhinitis will xyzal.  Follow up to be determined after lab and xray review.

## 2018-08-20 NOTE — Progress Notes (Signed)
Subjective:    Patient ID: Crystal LatheGloria Genao, female    DOB: 1940/08/06, 78 y.o.   MRN: [id number]  HPI   Pt had occasional cough presumed to be allergies. Has for years. A little runny nose. Just random clear throat cough. Pt has been staying at home and goes out very rarely. Pt has allergies this time of the year.(initially we had her go to car for phone visit until I talked with her to determine if appropiate to see in office). Later decided covid presentation so saw pt in office. She came back up to office.  Pt states her back pain is still severe. She had some recent falls. Pain is not all the time. Most often when she stands on occasion. When she does have pain will runs in spells. Pt never got xray that was ordered on prior visits.  Pt states no pain on urination. She has hx of urge incontinence. She wears a diaper.  Pt does have some chronic nausea not associated back pain.   Pt has know low gfr. Pt recently hydrates herself more.  No reporting chronic abd pain due to ibs-diarrhea.  Pt has some high bp in past. No cardiac signs or symptoms. She did not cut back on lisinopril dose. Instead stopped it all together.    Review of Systems  Constitutional: Negative for chills, fatigue and fever.  HENT: Positive for postnasal drip. Negative for congestion, ear discharge, ear pain, mouth sores, sinus pressure, sinus pain, sore throat and tinnitus.        Chronic allergies.  Respiratory: Positive for cough. Negative for chest tightness, shortness of breath and wheezing.        Rare transient occasional. Did not cough for entire time I talked with her.  Cardiovascular: Negative for chest pain and palpitations.  Gastrointestinal: Negative for abdominal pain, constipation, nausea and vomiting.  Genitourinary: Positive for frequency. Negative for dysuria, flank pain, genital sores and urgency.  Musculoskeletal: Positive for back pain. Negative for neck pain.  Skin: Negative for rash.   Neurological: Negative for dizziness, speech difficulty, weakness, numbness and headaches.  Hematological: Negative for adenopathy. Does not bruise/bleed easily.  Psychiatric/Behavioral: Negative for behavioral problems and confusion. The patient is not nervous/anxious.     Past Medical History:  Diagnosis Date  . Anxiety   . Depression   . Diabetes (HCC)   . Diabetes mellitus without complication (HCC)   . GERD (gastroesophageal reflux disease)   . Hyperlipidemia   . Hypertension   . Urine incontinence      Social History   Socioeconomic History  . Marital status: Widowed    Spouse name: Not on file  . Number of children: Not on file  . Years of education: Not on file  . Highest education level: Not on file  Occupational History  . Not on file  Social Needs  . Financial resource strain: Not on file  . Food insecurity    Worry: Not on file    Inability: Not on file  . Transportation needs    Medical: Not on file    Non-medical: Not on file  Tobacco Use  . Smoking status: Former Smoker    Packs/day: 2.00    Years: 15.00    Pack years: 30.00    Types: Cigarettes    Quit date: 02/21/1987    Years since quitting: 31.5  . Smokeless tobacco: Never Used  Substance and Sexual Activity  . Alcohol use: Not Currently    Frequency:  Never  . Drug use: Never  . Sexual activity: Not Currently  Lifestyle  . Physical activity    Days per week: Not on file    Minutes per session: Not on file  . Stress: Not on file  Relationships  . Social Herbalist on phone: Not on file    Gets together: Not on file    Attends religious service: Not on file    Active member of club or organization: Not on file    Attends meetings of clubs or organizations: Not on file    Relationship status: Not on file  . Intimate partner violence    Fear of current or ex partner: Not on file    Emotionally abused: Not on file    Physically abused: Not on file    Forced sexual activity: Not  on file  Other Topics Concern  . Not on file  Social History Narrative  . Not on file    Past Surgical History:  Procedure Laterality Date  . NECK SURGERY      No family history on file.  Allergies  Allergen Reactions  . Penicillins     Current Outpatient Medications on File Prior to Visit  Medication Sig Dispense Refill  . albuterol (PROAIR HFA) 108 (90 Base) MCG/ACT inhaler Inhale 2 puffs into the lungs every 6 (six) hours as needed for wheezing or shortness of breath. 18 g 5  . atorvastatin (LIPITOR) 40 MG tablet Take 40 mg by mouth daily.    . budesonide-formoterol (SYMBICORT) 80-4.5 MCG/ACT inhaler 2 inhalation twice daily 1 Inhaler 3  . Cholecalciferol (D3 VITAMIN PO) Take 2,000 Units by mouth.    . clonazePAM (KLONOPIN) 0.5 MG tablet TAKE 1 TABLET BY MOUTH AT BEDTIME 30 tablet 2  . dicyclomine (BENTYL) 10 MG capsule Take 1 capsule (10 mg total) by mouth 4 (four) times daily -  before meals and at bedtime. 120 capsule 1  . famotidine (PEPCID) 20 MG tablet Take 1 tablet (20 mg total) by mouth daily. 30 tablet 3  . gabapentin (NEURONTIN) 800 MG tablet Take 800 mg by mouth at bedtime.    . gabapentin (NEURONTIN) 800 MG tablet Take 1 tablet (800 mg total) by mouth at bedtime. 30 tablet 3  . lisinopril (ZESTRIL) 20 MG tablet TAKE 1 TABLET(20 MG) BY MOUTH DAILY 90 tablet 0  . Magnesium Citrate 125 MG CAPS Take by mouth.    . metFORMIN (GLUCOPHAGE) 500 MG tablet Take by mouth 3 (three) times daily.    . metoprolol tartrate (LOPRESSOR) 50 MG tablet TAKE 1 TABLET(50 MG) BY MOUTH TWICE DAILY 180 tablet 0  . omeprazole (PRILOSEC) 40 MG capsule Take 40 mg by mouth daily.    . ondansetron (ZOFRAN ODT) 4 MG disintegrating tablet Take 1 tablet (4 mg total) by mouth every 8 (eight) hours as needed for nausea or vomiting. 20 tablet 0  . venlafaxine XR (EFFEXOR-XR) 150 MG 24 hr capsule TAKE 2 CAPSULES BY MOUTH DAILY 180 capsule 0   No current facility-administered medications on file prior  to visit.     There were no vitals taken for this visit.      Objective:   Physical Exam   General Mental Status- Alert. General Appearance- Not in acute distress.   Skin General: Color- Normal Color. Moisture- Normal Moisture.  Neck Carotid Arteries- Normal color. Moisture- Normal Moisture. No carotid bruits. No JVD.  Chest and Lung Exam Auscultation: Breath Sounds:-Normal.  Cardiovascular Auscultation:Rythm- Regular.  Murmurs & Other Heart Sounds:Auscultation of the heart reveals- No Murmurs.  Abdomen Inspection:-Inspeection Normal. Palpation/Percussion:Note:No mass. Palpation and Percussion of the abdomen reveal- Non Tender, Non Distended + BS, no rebound or guarding.   Neurologic Cranial Nerve exam:- CN III-XII intact(No nystagmus), symmetric smile. Strength:- 5/5 equal and symmetric strength both upper and lower extremities.      Assessment & Plan:  For recent fall bp med dose reduced. Going forward also advised on postural bp changes and get balance before ambulating. BP is high today. But she stopped taking lisinopril all together. Get back lisinopril 10 mg daily. Check bp daily and call us in one week.  For abdomen pain close to baseline with nausea advised pt to go ahead and get the labs placed on last visit. Continue current meds. Any significant change in abdomen pain then ED evaluation as discussed. For nausea can use zofran.  For frequent urination get urine culture order placed on last visit.  For low back pain hx recommend getting the lumbar xray, use  tylenolotc and lidocaine patch otc.  Diabetes discussed today. Will repeat a1c in 2 months. Continue metformin.  For allergic rhinitis will xyzal.  Follow up to be determined after lab and xray review.  25 minutes spent with pt. 50% of time spent couneling on plan and treatment going forward.   Esperanza RichtersEdward Shia Eber, PA-C

## 2018-08-22 ENCOUNTER — Telehealth: Payer: Self-pay | Admitting: Family

## 2018-08-22 DIAGNOSIS — K859 Acute pancreatitis without necrosis or infection, unspecified: Secondary | ICD-10-CM

## 2018-08-22 LAB — URINE CULTURE
MICRO NUMBER:: 660295
Result:: NO GROWTH
SPECIMEN QUALITY:: ADEQUATE

## 2018-08-22 NOTE — Telephone Encounter (Signed)
Will you call patient and remind her to get the CT of abdomen to evaluate her elevated lipase/pancrease protein. During interim recommend eat bland diet and avoid greasy/fatty foods. Would you also call down stairs and verify that she is being scheduled.  Please give me update.   I am not sure if she needs prior authorization or not with medicare.  If you find out she does can you notify [facility]

## 2018-08-22 NOTE — Telephone Encounter (Signed)
Pt seen on 08/20/2018.

## 2018-08-22 NOTE — Telephone Encounter (Signed)
Reviewed lab work with patient.   Pt reports + nausea, abdominal pain post prandial.  + elevated lipase. Recommend CT abd/pelvis for further evaluation. Pt is agreeable.  Danton Sewer, I have placed CT order on her.

## 2018-08-23 NOTE — Telephone Encounter (Signed)
I called pt and explained reasoning on getting ct abdomen. Pt agreed she would get. Radiology already called to schedule her. She agrees will call them back to schedule. Pt expressed understanding.

## 2018-08-23 NOTE — Telephone Encounter (Signed)
Crystal Mckenzie from imaging called and left pt a message to call back to schedule CT.

## 2018-08-23 NOTE — Telephone Encounter (Signed)
Crystal Mckenzie called pt again. Pt states she does not know if she wants to have the  CT done and she wants [name] to call her.

## 2018-08-24 ENCOUNTER — Telehealth: Payer: Self-pay

## 2018-08-24 NOTE — Telephone Encounter (Signed)
Copied from [place] [id number]. Topic: General - Other >> Aug 24, 2018 12:01 PM Keene Breath wrote: Reason for CRM: Patient is calling to ask the nurse or doctor to call her back regarding a procedure that the doctor wants her to have.  Patient has decided to go ahead and have the CT.  Please advise and call patient at [phone]

## 2018-08-24 NOTE — Telephone Encounter (Signed)
Pt given imaging number to call to schedule CT.

## 2018-08-28 ENCOUNTER — Ambulatory Visit (HOSPITAL_BASED_OUTPATIENT_CLINIC_OR_DEPARTMENT_OTHER): Payer: Medicare Other

## 2018-08-31 ENCOUNTER — Other Ambulatory Visit: Payer: Self-pay

## 2018-08-31 ENCOUNTER — Telehealth: Payer: Self-pay | Admitting: Medical

## 2018-08-31 ENCOUNTER — Ambulatory Visit (HOSPITAL_BASED_OUTPATIENT_CLINIC_OR_DEPARTMENT_OTHER)
Admission: RE | Admit: 2018-08-31 | Discharge: 2018-08-31 | Disposition: A | Discharge status: Home / Self Care | Payer: Medicare Other | Source: Ambulatory Visit | Attending: Family | Admitting: Family

## 2018-08-31 DIAGNOSIS — K859 Acute pancreatitis without necrosis or infection, unspecified: Secondary | ICD-10-CM

## 2018-08-31 MED ORDER — IOHEXOL 300 MG/ML  SOLN
100.0000 mL | Freq: Once | INTRAMUSCULAR | Status: AC | PRN
  Administered 2018-08-31: 14:00:00 80 mL via INTRAVENOUS

## 2018-08-31 NOTE — Telephone Encounter (Signed)
Called down to radiology around 12:25 to discuss with pt. Pt had already started. I thought best to use Ct with contrast. Fellow provider ordered the test.

## 2018-08-31 NOTE — Telephone Encounter (Signed)
Patient came into the office wanting to speak with Crystal Mckenzie about CT scan and did not want to drink the liquid before the CT scan. She wants to know if it is possible to do the scan without drinking the liquid before and if the results will come back. Please advise. She is suppose to start drinking the liquid at 12:30.

## 2018-09-04 ENCOUNTER — Encounter: Payer: Self-pay | Admitting: Medical

## 2018-09-04 ENCOUNTER — Telehealth: Payer: Self-pay | Admitting: Medical

## 2018-09-04 DIAGNOSIS — R109 Unspecified abdominal pain: Secondary | ICD-10-CM

## 2018-09-04 NOTE — Telephone Encounter (Signed)
Referral to gi placed. °

## 2018-09-05 ENCOUNTER — Encounter: Payer: Self-pay | Admitting: Gastroenterology

## 2018-09-13 ENCOUNTER — Other Ambulatory Visit: Payer: Self-pay | Admitting: Medical

## 2018-09-17 NOTE — Telephone Encounter (Addendum)
Refill request: Clonazepam   Last RX: 06/20/18 Last OV:08/20/18 Next OV: none scheduled  UDS: 03/14/18   CSC: 03/14/18 CSR:09/19/2018

## 2018-09-19 ENCOUNTER — Other Ambulatory Visit: Payer: Self-pay

## 2018-09-19 MED ORDER — FREESTYLE TEST VI STRP: ORAL_STRIP | 12 refills | Status: DC

## 2018-09-24 ENCOUNTER — Telehealth: Payer: Self-pay | Admitting: Medical

## 2018-09-24 NOTE — Telephone Encounter (Signed)
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  The strips called in for her were for a FREESTYLE meter.  Please correct and re send.  Pt states she only has a few strips left.   Has the patient contacted their pharmacy? Yes.     Preferred Pharmacy:  [id number] - [place], [place] - [address] AT [place] [phone] (Phone) [phone] (Fax)

## 2018-09-24 NOTE — Telephone Encounter (Signed)
Requested medication (s) are due for refill today: No  Requested medication (s) are on the active medication list: Yes  Last refill:  N/A  Future visit scheduled: nO  Notes to clinic:  Pt. Is requesting True Metrix Air test strips be sent in.    There are no medications in this encounter.

## 2018-09-25 NOTE — Telephone Encounter (Signed)
Test strips called in to [facility]

## 2018-09-26 ENCOUNTER — Encounter: Payer: Self-pay | Admitting: *Deleted

## 2018-09-26 MED ORDER — TRUE METRIX BLOOD GLUCOSE TEST VI STRP: ORAL_STRIP | 1 refills | Status: DC

## 2018-09-28 ENCOUNTER — Ambulatory Visit: Payer: Medicare Other | Admitting: Gastroenterology

## 2018-10-01 ENCOUNTER — Telehealth: Payer: Self-pay | Admitting: Medical

## 2018-10-01 ENCOUNTER — Encounter: Payer: Self-pay | Admitting: Medical

## 2018-10-01 MED ORDER — BLOOD GLUCOSE METER KIT: PACK | 0 refills | Status: DC

## 2018-10-01 NOTE — Telephone Encounter (Signed)
Medication Refill - Medication: Accucheck or One Touch meter and strips (Patient is requesting a new meter and test strips due to the costs of the one prescribed previously and what her insurance will cover.)  Has the patient contacted their pharmacy? Yes (Agent: If no, request that the patient contact the pharmacy for the refill.) (Agent: If yes, when and what did the pharmacy advise?)Contact PCP  Preferred Pharmacy (with phone number or street name): [id number] - [place], [place] - [address] AT [place] [phone] (Phone) [phone] (Fax)     Agent: Please be advised that RX refills may take up to 3 business days. We ask that you follow-up with your pharmacy.

## 2018-10-10 ENCOUNTER — Telehealth: Payer: Self-pay | Admitting: Medical

## 2018-10-10 ENCOUNTER — Ambulatory Visit: Payer: Self-pay | Admitting: *Deleted

## 2018-10-10 NOTE — Telephone Encounter (Signed)
   Reason for Disposition . MILD difficulty breathing (e.g., minimal/no SOB at rest, SOB with walking, pulse <100)    Patient has COPD, has not been using her inhalers  Answer Assessment - Initial Assessment Questions 1. COVID-19 DIAGNOSIS: "Who made your Coronavirus (COVID-19) diagnosis?" "Was it confirmed by a positive lab test?" If not diagnosed by a HCP, ask "Are there lots of cases (community spread) where you live?" (See public health department website, if unsure)     Has not been tested 2. ONSET: "When did the COVID-19 symptoms start?"      1 week ago 3. WORST SYMPTOM: "What is your worst symptom?" (e.g., cough, fever, shortness of breath, muscle aches)     Cough and wheezing  4. COUGH: "Do you have a cough?" If so, ask: "How bad is the cough?"       Productive mild cough  5. FEVER: "Do you have a fever?" If so, ask: "What is your temperature, how was it measured, and when did it start?"     No Fever 6. RESPIRATORY STATUS: "Describe your breathing?" (e.g., shortness of breath, wheezing, unable to speak)      Occasional wheezing, mild shortness of breath  7. BETTER-SAME-WORSE: "Are you getting better, staying the same or getting worse compared to yesterday?"  If getting worse, ask, "In what way?"     Same  8. HIGH RISK DISEASE: "Do you have any chronic medical problems?" (e.g., asthma, heart or lung disease, weak immune system, etc.)    COPD  9. PREGNANCY: "Is there any chance you are pregnant?" "When was your last menstrual period?"  N/A      10. OTHER SYMPTOMS: "Do you have any other symptoms?"  (e.g., chills, fatigue, headache, loss of smell or taste, muscle pain, sore throat)      Headache and muscle pain  Protocols used: CORONAVIRUS (COVID-19) DIAGNOSED OR SUSPECTED-A-AH  Patient states she began having productive cough (yellow sputum) and occasional wheezing one week ago.  She is concerned that she could have COVID.  She has not been exposed that she knows of, but has been  to the grocery store and to restaurants wearing mask.  She denies fever and chest pain.  States she has had occasional shortness of breath, but has not been using her inhalers because she was afraid they would "make it worse".    Advised her that she should use her Symbicort inhaler 2 puffs twice daily and albuterol as needed, that the inhalers should not be harmful to her.  Advised patient that her PCP office is closed now, and I will send a note to them with high priority asking them to call and schedule her for a virtual visit tomorrow morning.  Advised her that if she has worsening shortness of breath or chest pain she should call 911.  Patient expressed understanding and is agreeable with plan.

## 2018-10-10 NOTE — Telephone Encounter (Signed)
Pt called wanting to speak with Dr. Harvie Heck she wants to speak with him personally and said she will call back tomorrow/ please advise

## 2018-10-11 ENCOUNTER — Ambulatory Visit (INDEPENDENT_AMBULATORY_CARE_PROVIDER_SITE_OTHER): Payer: Medicare Other | Admitting: Medical

## 2018-10-11 ENCOUNTER — Other Ambulatory Visit: Payer: Self-pay | Admitting: Medical

## 2018-10-11 ENCOUNTER — Encounter: Payer: Self-pay | Admitting: Medical

## 2018-10-11 VITALS — BP 126/67 | HR 72 | Ht 61.0 in | Wt 157.0 lb

## 2018-10-11 DIAGNOSIS — J309 Allergic rhinitis, unspecified: Secondary | ICD-10-CM

## 2018-10-11 DIAGNOSIS — R05 Cough: Secondary | ICD-10-CM | POA: Diagnosis not present

## 2018-10-11 DIAGNOSIS — R062 Wheezing: Secondary | ICD-10-CM | POA: Diagnosis not present

## 2018-10-11 DIAGNOSIS — R0981 Nasal congestion: Secondary | ICD-10-CM

## 2018-10-11 DIAGNOSIS — R059 Cough, unspecified: Secondary | ICD-10-CM

## 2018-10-11 MED ORDER — FLUTICASONE PROPIONATE 50 MCG/ACT NA SUSP: 2.0000 | Freq: Every day | NASAL | 1 refills | Status: DC

## 2018-10-11 MED ORDER — AZITHROMYCIN 250 MG PO TABS: ORAL_TABLET | ORAL | 0 refills | Status: DC

## 2018-10-11 MED ORDER — LEVOCETIRIZINE DIHYDROCHLORIDE 5 MG PO TABS: 5.0000 mg | ORAL_TABLET | Freq: Every evening | ORAL | 0 refills | Status: AC

## 2018-10-11 NOTE — Telephone Encounter (Signed)
See triage note- Pt needing appt. Forwarded to [employer] for scheduling.

## 2018-10-11 NOTE — Patient Instructions (Addendum)
You do some symptoms that indicate possible bronchitis.  Due to the pandemic and your symptoms today it was necessary to evaluate you virtually.  I do think it is best that you go ahead and start a azithromycin antibiotic for possible bone infection and prescribed Flonase for nasal congestion.  You do have history of allergies so making Xyzal antihistamine available.  Offered cough medicine benzonatate but that was declined today.  If you have worsening cough please let me know and would send that prescription in.  With your reported symptoms and risk of complication score for COVID, I do think it is best for you to get her Covid test through the [place] center.  Order placed and given address and hours for the drive-through testing center.  Please go today or tomorrow morning.  Follow-up date to be determined based on clinical response to treatment and results of test.  If you have worsening or changing signs symptoms then be evaluated in the emergency department.  Patient will continue to stay at home.

## 2018-10-11 NOTE — Progress Notes (Addendum)
Subjective:    Patient ID: Crystal Mckenzie, female    DOB: 1940-08-23, 78 y.o.   MRN: [id number]  HPI  Virtual Visit via Telephone Note  I connected with Crystal Mckenzie on 10/11/18 at 11:20 AM EDT by telephone and verified that I am speaking with the correct person using two identifiers.  Location: Patient: home Provider: office   I discussed the limitations, risks, security and privacy concerns of performing an evaluation and management service by telephone and the availability of in person appointments. I also discussed with the patient that there may be a patient responsible charge related to this service. The patient expressed understanding and agreed to proceed.   History of Present Illness: Pt in states some wheezing off and on for one week. She has begun to cough some as well. Pt states history of wheezing in past but some time since significant wheeze before most recent bout of wheeze.  Pt states cough became productive about one week ago.  Pt had covid concern but think maybe she over reacted. Now she thinks has allergies. Pt has symbicort but just started yesterday. She feels better.  Pt has 02 sat monitor at home. Her o2 sat was 95% yesterday.     Observations/Objective: General-no acute distress, pleasant, oriented and normal speech.  Assessment and Plan: You do some symptoms that indicate possible bronchitis.  Due to the pandemic and your symptoms today it was necessary to evaluate you virtually.  I do think it is best that you go ahead and start a azithromycin antibiotic for possible bone infection and prescribed Flonase for nasal congestion.  You do have history of allergies so making Xyzal antihistamine available.  Offered cough medicine benzonatate but that was declined today.  If you have worsening cough please let me know and would send that prescription in.  With your reported symptoms and risk of complication score for COVID, I do think it is best for you to  get her covid test through the [facility] center.  Order placed and given address and hours for the drive-through testing center.  Please go today or tomorrow morning.  Follow-up date to be determined based on clinical response to treatment and results of test.  If you have worsening or changing signs symptoms then be evaluated in the emergency department.  Patient will continue to stay at home.   Follow Up Instructions:    I discussed the assessment and treatment plan with the patient. The patient was provided an opportunity to ask questions and all were answered. The patient agreed with the plan and demonstrated an understanding of the instructions.   The patient was advised to call back or seek an in-person evaluation if the symptoms worsen or if the condition fails to improve as anticipated.  I provided 25 minutes of non-face-to-face time during this encounter.   Mackie Pai, PA-C   Review of Systems  Constitutional: Negative for chills, fatigue and fever.  HENT: Positive for congestion and rhinorrhea. Negative for postnasal drip, sinus pressure and sinus pain.   Respiratory: Positive for cough and wheezing. Negative for shortness of breath.   Cardiovascular: Negative for chest pain and palpitations.  Gastrointestinal: Negative for abdominal pain, constipation, diarrhea and vomiting.  Musculoskeletal: Negative for back pain.  Skin: Negative for rash.  Neurological: Negative for dizziness, numbness and headaches.  Hematological: Negative for adenopathy. Does not bruise/bleed easily.  Psychiatric/Behavioral: Negative for behavioral problems and sleep disturbance. The patient is nervous/anxious.  Objective:   Physical Exam        Assessment & Plan:

## 2018-10-12 ENCOUNTER — Other Ambulatory Visit: Payer: Self-pay

## 2018-10-12 DIAGNOSIS — Z20822 Contact with and (suspected) exposure to covid-19: Secondary | ICD-10-CM

## 2018-10-14 LAB — NOVEL CORONAVIRUS, NAA: SARS-CoV-2, NAA: NOT DETECTED

## 2018-11-02 NOTE — Progress Notes (Addendum)
Virtual Visit via Video Note  I connected with patient on 11/05/18 at  2:30 PM EDT by audio enabled telemedicine application and verified that I am speaking with the correct person using two identifiers.   THIS ENCOUNTER IS A VIRTUAL VISIT DUE TO COVID-19 - PATIENT WAS NOT SEEN IN THE OFFICE. PATIENT HAS CONSENTED TO VIRTUAL VISIT / TELEMEDICINE VISIT   Location of patient: home  Location of provider: office  I discussed the limitations of evaluation and management by telemedicine and the availability of in person appointments. The patient expressed understanding and agreed to proceed.   Subjective:   Crystal Mckenzie is a 78 y.o. female who presents for an Initial Medicare Annual Wellness Visit.  Review of Systems    Home Safety/Smoke Alarms: Feels safe in home. Smoke alarms in place.  Lives alone in mobile home. Has a small dog.Talks w/ niece daily. Pt states she has a walker, but does not use it.   Female:      Mammo- declines      Dexa scan-  declines      CCS- declines     Objective:    Today's Vitals   11/05/18 1432  Weight: 157 lb 6.4 oz (71.4 kg)   Body mass index is 29.74 kg/m.  Advanced Directives 11/05/2018  Does Patient Have a Medical Advance Directive? No  Would patient like information on creating a medical advance directive? No - Patient declined    Current Medications (verified) Outpatient Encounter Medications as of 11/05/2018  Medication Sig  . albuterol (PROAIR HFA) 108 (90 Base) MCG/ACT inhaler Inhale 2 puffs into the lungs every 6 (six) hours as needed for wheezing or shortness of breath.  Marland Kitchen atorvastatin (LIPITOR) 40 MG tablet Take 40 mg by mouth daily.  . budesonide-formoterol (SYMBICORT) 80-4.5 MCG/ACT inhaler 2 inhalation twice daily  . Cholecalciferol (D3 VITAMIN PO) Take 2,000 Units by mouth.  . clonazePAM (KLONOPIN) 0.5 MG tablet TAKE 1 TABLET BY MOUTH AT BEDTIME  . dicyclomine (BENTYL) 10 MG capsule Take 1 capsule (10 mg total) by mouth 4  (four) times daily -  before meals and at bedtime.  . famotidine (PEPCID) 20 MG tablet Take 1 tablet (20 mg total) by mouth daily.  . fluticasone (FLONASE) 50 MCG/ACT nasal spray SHAKE LIQUID AND USE 2 SPRAYS IN EACH NOSTRIL DAILY  . gabapentin (NEURONTIN) 800 MG tablet Take 800 mg by mouth at bedtime.  . gabapentin (NEURONTIN) 800 MG tablet TAKE 1 TABLET(800 MG) BY MOUTH AT BEDTIME  . glucose blood (TRUE METRIX BLOOD GLUCOSE TEST) test strip Use to check sugar twice a day. Dx code: E11.9  . levocetirizine (XYZAL) 5 MG tablet Take 1 tablet (5 mg total) by mouth every evening.  Marland Kitchen lisinopril (ZESTRIL) 20 MG tablet TAKE 1 TABLET(20 MG) BY MOUTH DAILY  . Magnesium Citrate 125 MG CAPS Take by mouth.  . metFORMIN (GLUCOPHAGE) 500 MG tablet Take by mouth 3 (three) times daily.  . metoprolol tartrate (LOPRESSOR) 50 MG tablet TAKE 1 TABLET(50 MG) BY MOUTH TWICE DAILY  . omeprazole (PRILOSEC) 40 MG capsule Take 40 mg by mouth daily.  . ondansetron (ZOFRAN ODT) 4 MG disintegrating tablet Take 1 tablet (4 mg total) by mouth every 8 (eight) hours as needed for nausea or vomiting.  . venlafaxine XR (EFFEXOR-XR) 150 MG 24 hr capsule TAKE 2 CAPSULES BY MOUTH DAILY  . blood glucose meter kit and supplies Use to check sugar twice a day. Dx code: E11.9 (Patient not taking: Reported on  11/05/2018)  . [DISCONTINUED] azithromycin (ZITHROMAX) 250 MG tablet Take 2 tablets by mouth on day 1, followed by 1 tablet by mouth daily for 4 days.   No facility-administered encounter medications on file as of 11/05/2018.     Allergies (verified) Penicillins   History: Past Medical History:  Diagnosis Date  . Anxiety   . Depression   . Diabetes ([place])   . Diabetes mellitus without complication ([place])   . GERD (gastroesophageal reflux disease)   . Hyperlipidemia   . Hypertension   . Urine incontinence    Past Surgical History:  Procedure Laterality Date  . NECK SURGERY     History reviewed. No pertinent family  history. Social History   Socioeconomic History  . Marital status: Widowed    Spouse name: Not on file  . Number of children: Not on file  . Years of education: Not on file  . Highest education level: Not on file  Occupational History  . Not on file  Social Needs  . Financial resource strain: Not on file  . Food insecurity    Worry: Not on file    Inability: Not on file  . Transportation needs    Medical: Not on file    Non-medical: Not on file  Tobacco Use  . Smoking status: Former Smoker    Packs/day: 2.00    Years: 15.00    Pack years: 30.00    Types: Cigarettes    Quit date: 02/21/1987    Years since quitting: 31.7  . Smokeless tobacco: Never Used  Substance and Sexual Activity  . Alcohol use: Not Currently    Frequency: Never  . Drug use: Never  . Sexual activity: Not Currently  Lifestyle  . Physical activity    Days per week: Not on file    Minutes per session: Not on file  . Stress: Not on file  Relationships  . Social Herbalist on phone: Not on file    Gets together: Not on file    Attends religious service: Not on file    Active member of club or organization: Not on file    Attends meetings of clubs or organizations: Not on file    Relationship status: Not on file  Other Topics Concern  . Not on file  Social History Narrative  . Not on file    Tobacco Counseling Counseling given: Not Answered   Clinical Intake Pain : No/denies pain   Activities of Daily Living In your present state of health, do you have any difficulty performing the following activities: 11/05/2018  Hearing? N  Vision? N  Difficulty concentrating or making decisions? N  Walking or climbing stairs? N  Dressing or bathing? N  Doing errands, shopping? N  Preparing Food and eating ? N  Using the Toilet? N  In the past six months, have you accidently leaked urine? N  Do you have problems with loss of bowel control? N  Managing your Medications? N  Managing your  Finances? N  Housekeeping or managing your Housekeeping? N     Immunizations and Health Maintenance  There is no immunization history on file for this patient. Health Maintenance Due  Topic Date Due  . TETANUS/TDAP  08/31/1958  . DEXA SCAN  08/30/2004  . PNA vac Low Risk Adult (1 of 2 - PCV13) 08/30/2004  . INFLUENZA VACCINE  09/08/2018    Patient Care Team: Saguier, Iris Pert as PCP - General (Internal Medicine)  Indicate any  recent Medical Services you may have received from other than [facility] providers in the past year (date may be approximate).     Assessment:   This is a routine wellness examination for Salyer. Physical assessment deferred to PCP.  Hearing/Vision screen Unable to assess. This visit is enabled though telemedicine due to Covid 19.   Dietary issues and exercise activities discussed: Current Exercise Habits: The patient does not participate in regular exercise at present, Exercise limited by: None identified Diet (meal preparation, eat out, water intake, caffeinated beverages, dairy products, fruits and vegetables): 24 hr recall Breakfast: special K Lunch: cream of chk soup Dinner:   veg beef soup  Goals    . DIET - EAT MORE FRUITS AND VEGETABLES      Depression Screen PHQ 2/9 Scores 11/05/2018  PHQ - 2 Score 4  PHQ- 9 Score 10    Fall Risk Fall Risk  11/05/2018  Falls in the past year? 1  Number falls in past yr: 1  Injury with Fall? 0     Cognitive Function:     6CIT Screen 11/05/2018  What Year? 0 points  What month? 0 points  What time? 0 points  Count back from 20 0 points  Months in reverse 0 points  Repeat phrase 10 points  Total Score 10    Screening Tests Health Maintenance  Topic Date Due  . TETANUS/TDAP  08/31/1958  . DEXA SCAN  08/30/2004  . PNA vac Low Risk Adult (1 of 2 - PCV13) 08/30/2004  . INFLUENZA VACCINE  09/08/2018     Plan:   See you next year!  Continue to eat heart healthy diet (full of fruits,  vegetables, whole grains, lean protein, water--limit salt, fat, and sugar intake) and increase physical activity as tolerated.  Continue doing brain stimulating activities (puzzles, reading, adult coloring books, staying active) to keep memory sharp.    I have personally reviewed and noted the following in the patient's chart:   . Medical and social history . Use of alcohol, tobacco or illicit drugs  . Current medications and supplements . Functional ability and status . Nutritional status . Physical activity . Advanced directives . List of other physicians . Hospitalizations, surgeries, and ER visits in previous 12 months . Vitals . Screenings to include cognitive, depression, and falls . Referrals and appointments  In addition, I have reviewed and discussed with patient certain preventive protocols, quality metrics, and best practice recommendations. A written personalized care plan for preventive services as well as general preventive health recommendations were provided to patient.     Crystal Mckenzie, [place]   11/05/2018     Reviewed and agree with assesment & plan of RN.  Mackie Pai, PA-C

## 2018-11-05 ENCOUNTER — Other Ambulatory Visit: Payer: Self-pay

## 2018-11-05 ENCOUNTER — Ambulatory Visit (INDEPENDENT_AMBULATORY_CARE_PROVIDER_SITE_OTHER): Payer: Medicare Other | Admitting: *Deleted

## 2018-11-05 ENCOUNTER — Encounter: Payer: Self-pay | Admitting: *Deleted

## 2018-11-05 VITALS — Wt 157.4 lb

## 2018-11-05 DIAGNOSIS — Z0000 Encounter for general adult medical examination without abnormal findings: Secondary | ICD-10-CM | POA: Diagnosis not present

## 2018-11-05 NOTE — Patient Instructions (Signed)
See you next year!  Continue to eat heart healthy diet (full of fruits, vegetables, whole grains, lean protein, water--limit salt, fat, and sugar intake) and increase physical activity as tolerated.  Continue doing brain stimulating activities (puzzles, reading, adult coloring books, staying active) to keep memory sharp.    Crystal Mckenzie , Thank you for taking time to come for your Medicare Wellness Visit. I appreciate your ongoing commitment to your health goals. Please review the following plan we discussed and let me know if I can assist you in the future.   These are the goals we discussed: Goals    . DIET - EAT MORE FRUITS AND VEGETABLES       This is a list of the screening recommended for you and due dates:  Health Maintenance  Topic Date Due  . Tetanus Vaccine  08/31/1958  . DEXA scan (bone density measurement)  08/30/2004  . Pneumonia vaccines (1 of 2 - PCV13) 08/30/2004  . Flu Shot  09/08/2018     Health Maintenance After Age 27 After age 49, you are at a higher risk for certain long-term diseases and infections as well as injuries from falls. Falls are a major cause of broken bones and head injuries in people who are older than age 63. Getting regular preventive care can help to keep you healthy and well. Preventive care includes getting regular testing and making lifestyle changes as recommended by your health care provider. Talk with your health care provider about:  Which screenings and tests you should have. A screening is a test that checks for a disease when you have no symptoms.  A diet and exercise plan that is right for you. What should I know about screenings and tests to prevent falls? Screening and testing are the best ways to find a health problem early. Early diagnosis and treatment give you the best chance of managing medical conditions that are common after age 70. Certain conditions and lifestyle choices may make you more likely to have a fall. Your health  care provider may recommend:  Regular vision checks. Poor vision and conditions such as cataracts can make you more likely to have a fall. If you wear glasses, make sure to get your prescription updated if your vision changes.  Medicine review. Work with your health care provider to regularly review all of the medicines you are taking, including over-the-counter medicines. Ask your health care provider about any side effects that may make you more likely to have a fall. Tell your health care provider if any medicines that you take make you feel dizzy or sleepy.  Osteoporosis screening. Osteoporosis is a condition that causes the bones to get weaker. This can make the bones weak and cause them to break more easily.  Blood pressure screening. Blood pressure changes and medicines to control blood pressure can make you feel dizzy.  Strength and balance checks. Your health care provider may recommend certain tests to check your strength and balance while standing, walking, or changing positions.  Foot health exam. Foot pain and numbness, as well as not wearing proper footwear, can make you more likely to have a fall.  Depression screening. You may be more likely to have a fall if you have a fear of falling, feel emotionally low, or feel unable to do activities that you used to do.  Alcohol use screening. Using too much alcohol can affect your balance and may make you more likely to have a fall. What actions can I  take to lower my risk of falls? General instructions  Talk with your health care provider about your risks for falling. Tell your health care provider if: ? You fall. Be sure to tell your health care provider about all falls, even ones that seem minor. ? You feel dizzy, sleepy, or off-balance.  Take over-the-counter and prescription medicines only as told by your health care provider. These include any supplements.  Eat a healthy diet and maintain a healthy weight. A healthy diet  includes low-fat dairy products, low-fat (lean) meats, and fiber from whole grains, beans, and lots of fruits and vegetables. Home safety  Remove any tripping hazards, such as rugs, cords, and clutter.  Install safety equipment such as grab bars in bathrooms and safety rails on stairs.  Keep rooms and walkways well-lit. Activity   Follow a regular exercise program to stay fit. This will help you maintain your balance. Ask your health care provider what types of exercise are appropriate for you.  If you need a cane or walker, use it as recommended by your health care provider.  Wear supportive shoes that have nonskid soles. Lifestyle  Do not drink alcohol if your health care provider tells you not to drink.  If you drink alcohol, limit how much you have: ? 0-1 drink a day for women. ? 0-2 drinks a day for men.  Be aware of how much alcohol is in your drink. In the [place] one drink equals one typical bottle of beer (12 oz), one-half glass of wine (5 oz), or one shot of hard liquor (1 oz).  Do not use any products that contain nicotine or tobacco, such as cigarettes and e-cigarettes. If you need help quitting, ask your health care provider. Summary  Having a healthy lifestyle and getting preventive care can help to protect your health and wellness after age 48.  Screening and testing are the best way to find a health problem early and help you avoid having a fall. Early diagnosis and treatment give you the best chance for managing medical conditions that are more common for people who are older than age 37.  Falls are a major cause of broken bones and head injuries in people who are older than age 47. Take precautions to prevent a fall at home.  Work with your health care provider to learn what changes you can make to improve your health and wellness and to prevent falls. This information is not intended to replace advice given to you by your health care provider. Make sure you  discuss any questions you have with your health care provider. Document Released: 12/07/2016 Document Revised: 05/17/2018 Document Reviewed: 12/07/2016 Elsevier Patient Education  2020 [employer].

## 2018-11-10 DIAGNOSIS — S92151A Displaced avulsion fracture (chip fracture) of right talus, initial encounter for closed fracture: Secondary | ICD-10-CM | POA: Insufficient documentation

## 2018-11-16 ENCOUNTER — Other Ambulatory Visit: Payer: Self-pay | Admitting: Internal Medicine

## 2018-11-16 DIAGNOSIS — S8265XA Nondisplaced fracture of lateral malleolus of left fibula, initial encounter for closed fracture: Secondary | ICD-10-CM | POA: Insufficient documentation

## 2018-11-16 DIAGNOSIS — S82092D Other fracture of left patella, subsequent encounter for closed fracture with routine healing: Secondary | ICD-10-CM | POA: Insufficient documentation

## 2018-11-16 MED ORDER — CLONAZEPAM 0.5 MG PO TABS: 0.5000 mg | ORAL_TABLET | Freq: Every day | ORAL | 0 refills | Status: DC

## 2018-11-16 MED ORDER — OXYCODONE HCL 5 MG PO TABS: 5.0000 mg | ORAL_TABLET | Freq: Four times a day (QID) | ORAL | 0 refills | Status: DC | PRN

## 2018-11-19 ENCOUNTER — Non-Acute Institutional Stay (SKILLED_NURSING_FACILITY): Payer: Medicare Other | Admitting: Internal Medicine

## 2018-11-19 DIAGNOSIS — E1142 Type 2 diabetes mellitus with diabetic polyneuropathy: Secondary | ICD-10-CM

## 2018-11-19 DIAGNOSIS — S92151D Displaced avulsion fracture (chip fracture) of right talus, subsequent encounter for fracture with routine healing: Secondary | ICD-10-CM | POA: Diagnosis not present

## 2018-11-19 DIAGNOSIS — E1159 Type 2 diabetes mellitus with other circulatory complications: Secondary | ICD-10-CM | POA: Diagnosis not present

## 2018-11-19 DIAGNOSIS — S8265XD Nondisplaced fracture of lateral malleolus of left fibula, subsequent encounter for closed fracture with routine healing: Secondary | ICD-10-CM

## 2018-11-19 DIAGNOSIS — E1169 Type 2 diabetes mellitus with other specified complication: Secondary | ICD-10-CM

## 2018-11-19 DIAGNOSIS — S82092D Other fracture of left patella, subsequent encounter for closed fracture with routine healing: Secondary | ICD-10-CM

## 2018-11-19 DIAGNOSIS — E119 Type 2 diabetes mellitus without complications: Secondary | ICD-10-CM

## 2018-11-19 DIAGNOSIS — I10 Essential (primary) hypertension: Secondary | ICD-10-CM

## 2018-11-19 DIAGNOSIS — E785 Hyperlipidemia, unspecified: Secondary | ICD-10-CM

## 2018-11-21 ENCOUNTER — Encounter: Payer: Self-pay | Admitting: Internal Medicine

## 2018-11-21 LAB — CBC AND DIFFERENTIAL
HCT: 36 (ref 36–46)
Hemoglobin: 11.6 — AB (ref 12.0–16.0)
Neutrophils Absolute: 4
Platelets: 241 (ref 150–399)
WBC: 7.3

## 2018-11-21 LAB — BASIC METABOLIC PANEL
BUN: 30 — AB (ref 4–21)
Creatinine: 1 (ref 0.5–1.1)
Glucose: 127
Potassium: 4.9 (ref 3.4–5.3)
Sodium: 139 (ref 137–147)

## 2018-11-21 NOTE — Progress Notes (Signed)
: Provider:  Hennie Duos., MD Location:  [place] Room Number: 962-I Place of Service:  SNF (31)  PCP: Elise Benne Patient Care Team: Elise Benne as PCP - General (Internal Medicine)  Extended Emergency Contact Information Primary Emergency Contact: Beckey Rutter Mobile Phone: [phone] Relation: Niece     Allergies: Penicillins  Chief Complaint  Patient presents with  . New Admit To SNF    New admission to [facility]    HPI: Patient is a 78 y.o. female with hypertension, diabetes mellitus type 2, peripheral neuropathy, fibromyalgia, who fell at home and suffered several fractures simultaneously; her left patella and left ankle lateral malleolus and her right talus.  This affected weightbearing joints in both legs and she subsequently went from being fully functional at home and being nonambulatory.  She was admitted to [facility] from 10/3-9 where she was placed on narcotic pain medications and arrangements made for admission to skilled nursing facility.  Patient is admitted to SNF for OT/PT.  While at SNF patient will be followed for hypertension treated with metoprolol and lisinopril, hyperlipidemia treated with Lipitor and peripheral neuropathy treated with Neurontin.  Past Medical History:  Diagnosis Date  . Anxiety   . Depression   . Diabetes ([place])   . Diabetes mellitus without complication ([place])   . GERD (gastroesophageal reflux disease)   . Hyperlipidemia   . Hypertension   . Urine incontinence     Past Surgical History:  Procedure Laterality Date  . NECK SURGERY      Allergies as of 11/19/2018      Reactions   Penicillins       Medication List       Accurate as of November 19, 2018 11:59 PM. If you have any questions, ask your nurse or doctor.        STOP taking these medications   budesonide-formoterol 80-4.5 MCG/ACT inhaler Commonly known as: SYMBICORT   D3 VITAMIN PO    TAKE these medications   acetaminophen 325 MG tablet Commonly known as: TYLENOL Take 650 mg by mouth every 6 (six) hours. Scheduled   albuterol 108 (90 Base) MCG/ACT inhaler Commonly known as: ProAir HFA Inhale 2 puffs into the lungs every 6 (six) hours as needed for wheezing or shortness of breath.   aspirin 81 MG chewable tablet Chew 81 mg by mouth 2 (two) times daily.   atorvastatin 40 MG tablet Commonly known as: LIPITOR Take 40 mg by mouth daily.   blood glucose meter kit and supplies Use to check sugar twice a day. Dx code: E11.9   calcium carbonate 500 MG chewable tablet Commonly known as: TUMS - dosed in mg elemental calcium Chew 1 tablet by mouth daily.   clonazePAM 0.5 MG tablet Commonly known as: KLONOPIN Take 1 tablet (0.5 mg total) by mouth at bedtime.   dicyclomine 10 MG capsule Commonly known as: BENTYL Take 1 capsule (10 mg total) by mouth 4 (four) times daily -  before meals and at bedtime.   famotidine 20 MG tablet Commonly known as: PEPCID Take 20 mg by mouth 2 (two) times daily. What changed: Another medication with the same name was removed. Continue taking this medication, and follow the directions you see here.   fluticasone 50 MCG/ACT nasal spray Commonly known as: FLONASE Place 1 spray into both nostrils daily as needed for rhinitis. What changed: Another medication with the same name was removed. Continue taking this  medication, and follow the directions you see here.   gabapentin 800 MG tablet Commonly known as: NEURONTIN Take 800 mg by mouth at bedtime.   ibuprofen 200 MG tablet Commonly known as: ADVIL Take 600 mg by mouth every 6 (six) hours as needed for mild pain. 3 tablets to = 600 mg   levocetirizine 5 MG tablet Commonly known as: XYZAL Take 1 tablet (5 mg total) by mouth every evening.   lisinopril 20 MG tablet Commonly known as: ZESTRIL TAKE 1 TABLET(20 MG) BY MOUTH DAILY   Magnesium Citrate 125 MG Caps Take by mouth.    metFORMIN 500 MG tablet Commonly known as: GLUCOPHAGE Take by mouth 3 (three) times daily.   metoprolol tartrate 50 MG tablet Commonly known as: LOPRESSOR TAKE 1 TABLET(50 MG) BY MOUTH TWICE DAILY   omeprazole 40 MG capsule Commonly known as: PRILOSEC Take 40 mg by mouth daily.   ondansetron 4 MG disintegrating tablet Commonly known as: Zofran ODT Take 1 tablet (4 mg total) by mouth every 8 (eight) hours as needed for nausea or vomiting.   oxyCODONE 5 MG immediate release tablet Commonly known as: Roxicodone Take 1 tablet (5 mg total) by mouth every 6 (six) hours as needed for severe pain.   True Metrix Blood Glucose Test test strip Generic drug: glucose blood Use to check sugar twice a day. Dx code: E11.9   venlafaxine XR 150 MG 24 hr capsule Commonly known as: EFFEXOR-XR TAKE 2 CAPSULES BY MOUTH DAILY       No orders of the defined types were placed in this encounter.   Immunization History  Administered Date(s) Administered  . Influenza, High Dose Seasonal PF 11/16/2018  . Pneumococcal Polysaccharide-23 11/16/2018  . Zoster Recombinat (Shingrix) 12/01/2017, 04/20/2018    Social History   Tobacco Use  . Smoking status: Former Smoker    Packs/day: 2.00    Years: 15.00    Pack years: 30.00    Types: Cigarettes    Quit date: 02/21/1987    Years since quitting: 31.7  . Smokeless tobacco: Never Used  Substance Use Topics  . Alcohol use: Not Currently    Frequency: Never    Family history is mom with congestive heart failure diabetes mellitus and GERD; dad with COPD  Family History  Problem Relation Age of Onset  . Diabetes Mother   . Heart disease Mother   . COPD Father       Review of Systems  DATA OBTAINED: from patient, nurse GENERAL:  no fevers, fatigue, appetite changes SKIN: No itching, or rash EYES: No eye pain, redness, discharge EARS: No earache, tinnitus, change in hearing NOSE: No congestion, drainage or bleeding  MOUTH/THROAT: No  mouth or tooth pain, No sore throat RESPIRATORY: No cough, wheezing, SOB CARDIAC: No chest pain, palpitations, lower extremity edema  GI: No abdominal pain, No N/V/D or constipation, No heartburn or reflux  GU: No dysuria, frequency or urgency, or incontinence  MUSCULOSKELETAL: No unrelieved bone/joint pain NEUROLOGIC: No headache, dizziness or focal weakness PSYCHIATRIC: No c/o anxiety or sadness   Vitals:   11/21/18 1606  BP: (!) 103/57  Pulse: 95  Resp: 18  Temp: (!) 97.3 F (36.3 C)    SpO2 Readings from Last 1 Encounters:  10/11/18 98%   Body mass index is 29.74 kg/m.     Physical Exam  GENERAL APPEARANCE: Alert, conversant,  No acute distress.  SKIN: No diaphoresis rash HEAD: Normocephalic, atraumatic  EYES: Conjunctiva/lids clear. Pupils round, reactive. EOMs intact.  EARS: External exam WNL, canals clear. Hearing grossly normal.  NOSE: No deformity or discharge.  MOUTH/THROAT: Lips w/o lesions  RESPIRATORY: Breathing is even, unlabored. Lung sounds are clear   CARDIOVASCULAR: Heart RRR no murmurs, rubs or gallops. No peripheral edema.   GASTROINTESTINAL: Abdomen is soft, non-tender, not distended w/ normal bowel sounds. GENITOURINARY: Bladder non tender, not distended  MUSCULOSKELETAL: Ace wrap bilateral ankles; no swelling to left knee or bruising; patient says she has been up and walking NEUROLOGIC:  Cranial nerves 2-12 grossly intact. Moves all extremities  PSYCHIATRIC: Mood and affect appropriate to situation, no behavioral issues  There are no active problems to display for this patient.     Labs reviewed: Basic Metabolic Panel:    Component Value Date/Time   NA 136 08/20/2018 1203   K 5.1 08/20/2018 1203   CL 100 08/20/2018 1203   CO2 24 08/20/2018 1203   GLUCOSE 114 (H) 08/20/2018 1203   BUN 34 (H) 08/20/2018 1203   CREATININE 1.21 (H) 08/20/2018 1203   CALCIUM 10.1 08/20/2018 1203   PROT 7.6 08/20/2018 1203   ALBUMIN 4.9 08/20/2018 1203    AST 15 08/20/2018 1203   ALT 11 08/20/2018 1203   ALKPHOS 91 08/20/2018 1203   BILITOT 0.4 08/20/2018 1203    Recent Labs    02/21/18 1030 06/15/18 0929 08/20/18 1203  NA 137 140 136  K 4.7 4.6 5.1  CL 102 101 100  CO2 26 27 24   GLUCOSE 145* 152* 114*  BUN 24* 28* 34*  CREATININE 1.14 1.53* 1.21*  CALCIUM 10.1 10.1 10.1   Liver Function Tests: Recent Labs    02/21/18 1030 06/15/18 0929 08/20/18 1203  AST 17 20 15   ALT 14 16 11   ALKPHOS 79 78 91  BILITOT 0.4 0.5 0.4  PROT 7.2 7.8 7.6  ALBUMIN 4.5 4.6 4.9   Recent Labs    08/20/18 1203  LIPASE 71.0*   No results for input(s): AMMONIA in the last 8760 hours. CBC: Recent Labs    02/21/18 1030 06/15/18 0929 08/20/18 1203  WBC 6.2 7.6 8.5  NEUTROABS 3.1 4.5 4.9  HGB 11.9* 12.8 12.4  HCT 36.4 38.5 38.3  MCV 89.1 91.2 91.0  PLT 186.0 200.0 215.0   Lipid Recent Labs    02/21/18 1030  CHOL 186  HDL 45.60  LDLCALC 103*  TRIG 191.0*    Cardiac Enzymes: No results for input(s): CKTOTAL, CKMB, CKMBINDEX, TROPONINI in the last 8760 hours. BNP: No results for input(s): BNP in the last 8760 hours. No results found for: [facility] Lab Results  Component Value Date   HGBA1C 7.3 (H) 06/15/2018   Lab Results  Component Value Date   TSH 4.16 06/15/2018   Lab Results  Component Value Date   VITAMINB12 256 06/15/2018   No results found for: FOLATE Lab Results  Component Value Date   IRON 71 06/15/2018    Imaging and Procedures obtained prior to SNF admission: Ct Abdomen Pelvis W Contrast  Result Date: 08/31/2018 CLINICAL DATA:  Nausea and diarrhea for 1 year. Occasional vomiting. History of irritable bowel syndrome. EXAM: CT ABDOMEN AND PELVIS WITH CONTRAST TECHNIQUE: Multidetector CT imaging of the abdomen and pelvis was performed using the standard protocol following bolus administration of intravenous contrast. CONTRAST:  73m OMNIPAQUE IOHEXOL 300 MG/ML  SOLN COMPARISON:  None. FINDINGS: Lower  chest: Clear lung bases.  Heart normal size. Hepatobiliary: No focal liver abnormality is seen. Status post cholecystectomy. No biliary dilatation. Pancreas: Unremarkable. No pancreatic  ductal dilatation or surrounding inflammatory changes. Spleen: Normal in size without focal abnormality. Adrenals/Urinary Tract: No adrenal masses. Mild bilateral renal cortical thinning. Kidneys normal in position and orientation. Subcentimeter cortical low-density lesion, lateral mid to upper pole the right kidney, consistent with a cyst. No other renal masses or lesions, no stones and no hydronephrosis. Normal ureters. Normal bladder. Stomach/Bowel: Stomach is unremarkable. Small bowel and colon are normal in caliber. No wall thickening. No inflammation. No appendix visualized. No evidence of appendicitis. Vascular/Lymphatic: Aortic atherosclerosis. No aneurysm. No enlarged lymph nodes. Reproductive: Uterus and bilateral adnexa are unremarkable. Other: No abdominal wall hernia or abnormality. No abdominopelvic ascites. Musculoskeletal: No fracture or acute finding. No osteoblastic or osteolytic lesions. IMPRESSION: 1. No acute findings.  No evidence of bowel inflammation. 2. Status post cholecystectomy. 3. Aortic atherosclerosis. Electronically Signed   By: Lajean Manes M.D.   On: 08/31/2018 16:12     Not all labs, radiology exams or other studies done during hospitalization come through on my EPIC note; however they are reviewed by me.    Assessment and Plan  Avulsion fracture distal right talus-off the dorsal aspect; will be treated as a sprain Left knee fracture of inferior patellar esthenophyte left patella left ankle Avulsion fracture lateral malleolus left ankle SNF-admitted for OT/PT to get patient back to independent living Percocet 5/325 every 6 hours as needed  Hypertension SNF-controlled; continue metoprolol 50 mg twice daily and lisinopril 20 mg daily  Hyperlipidemia SNF-not stated as uncontrolled;  continue Lipitor 40 mg daily  Polyneuropathy SNF-appears controlled; continue Neurontin 800 mg nightly  Diabetes mellitus type 2 SNF-appears controlled; continue Glucophage 500 mg 3 times daily with meals patient is on ACE and patient is on statin   Time spent greater than 45 minutes;> 50% of time with patient was spent reviewing records, labs, tests and studies, counseling and developing plan of care  Hennie Duos, MD

## 2018-11-23 ENCOUNTER — Encounter: Payer: Self-pay | Admitting: Internal Medicine

## 2018-11-24 ENCOUNTER — Encounter: Payer: Self-pay | Admitting: Internal Medicine

## 2018-11-24 DIAGNOSIS — I10 Essential (primary) hypertension: Secondary | ICD-10-CM | POA: Insufficient documentation

## 2018-11-24 DIAGNOSIS — E1169 Type 2 diabetes mellitus with other specified complication: Secondary | ICD-10-CM | POA: Insufficient documentation

## 2018-11-24 DIAGNOSIS — E1159 Type 2 diabetes mellitus with other circulatory complications: Secondary | ICD-10-CM | POA: Insufficient documentation

## 2018-11-27 ENCOUNTER — Non-Acute Institutional Stay (SKILLED_NURSING_FACILITY): Payer: Medicare Other | Admitting: Internal Medicine

## 2018-11-27 ENCOUNTER — Encounter: Payer: Self-pay | Admitting: Internal Medicine

## 2018-11-27 DIAGNOSIS — E1169 Type 2 diabetes mellitus with other specified complication: Secondary | ICD-10-CM

## 2018-11-27 DIAGNOSIS — S82092D Other fracture of left patella, subsequent encounter for closed fracture with routine healing: Secondary | ICD-10-CM | POA: Diagnosis not present

## 2018-11-27 DIAGNOSIS — S8265XD Nondisplaced fracture of lateral malleolus of left fibula, subsequent encounter for closed fracture with routine healing: Secondary | ICD-10-CM | POA: Diagnosis not present

## 2018-11-27 DIAGNOSIS — E1159 Type 2 diabetes mellitus with other circulatory complications: Secondary | ICD-10-CM

## 2018-11-27 DIAGNOSIS — E785 Hyperlipidemia, unspecified: Secondary | ICD-10-CM

## 2018-11-27 DIAGNOSIS — S92151D Displaced avulsion fracture (chip fracture) of right talus, subsequent encounter for fracture with routine healing: Secondary | ICD-10-CM

## 2018-11-27 DIAGNOSIS — I10 Essential (primary) hypertension: Secondary | ICD-10-CM

## 2018-11-27 DIAGNOSIS — E1142 Type 2 diabetes mellitus with diabetic polyneuropathy: Secondary | ICD-10-CM

## 2018-11-27 DIAGNOSIS — E119 Type 2 diabetes mellitus without complications: Secondary | ICD-10-CM

## 2018-11-27 DIAGNOSIS — I152 Hypertension secondary to endocrine disorders: Secondary | ICD-10-CM

## 2018-11-27 NOTE — Progress Notes (Signed)
Location:  [place] Room Number: 119-J Place of Service:  SNF (31)  PCP: Mackie Pai, PA-C Patient Care Team: Elise Benne as PCP - General (Internal Medicine)  Extended Emergency Contact Information Primary Emergency Contact: Beckey Rutter Mobile Phone: [phone] Relation: Niece  Allergies  Allergen Reactions  . Penicillins     Chief Complaint  Patient presents with  . Discharge Note    Patient is seen for discharge on 11/29/18    HPI:  78 y.o. female with hypertension, diabetes mellitus type 2, peripheral neuropathy, fibromyalgia, who fell at home and suffered several fractures simultaneously-her left patella and left ankle lateral malleolus and right talus.  This affected weightbearing joints of both legs and she subsequently went from being fully functional at home to being nonambulatory.  She was admitted to [facility] from 10/3-9 where she was placed on narcotic pain medications and arrangements made for admission to skilled nursing facility patient was admitted this skilled nursing facility for OT/PT and is now ready to be discharged home with her niece.    Past Medical History:  Diagnosis Date  . Anxiety   . Depression   . Diabetes ([place])   . Diabetes mellitus without complication ([place])   . GERD (gastroesophageal reflux disease)   . Hyperlipidemia   . Hypertension   . Urine incontinence     Past Surgical History:  Procedure Laterality Date  . NECK SURGERY       reports that she quit smoking about 31 years ago. Her smoking use included cigarettes. She has a 30.00 pack-year smoking history. She has never used smokeless tobacco. She reports previous alcohol use. She reports that she does not use drugs. Social History   Socioeconomic History  . Marital status: Widowed    Spouse name: Not on file  . Number of children: Not on file  . Years of education: Not on file  . Highest education level: Not on  file  Occupational History  . Not on file  Social Needs  . Financial resource strain: Not on file  . Food insecurity    Worry: Not on file    Inability: Not on file  . Transportation needs    Medical: Not on file    Non-medical: Not on file  Tobacco Use  . Smoking status: Former Smoker    Packs/day: 2.00    Years: 15.00    Pack years: 30.00    Types: Cigarettes    Quit date: 02/21/1987    Years since quitting: 31.7  . Smokeless tobacco: Never Used  Substance and Sexual Activity  . Alcohol use: Not Currently    Frequency: Never  . Drug use: Never  . Sexual activity: Not Currently  Lifestyle  . Physical activity    Days per week: Not on file    Minutes per session: Not on file  . Stress: Not on file  Relationships  . Social Herbalist on phone: Not on file    Gets together: Not on file    Attends religious service: Not on file    Active member of club or organization: Not on file    Attends meetings of clubs or organizations: Not on file    Relationship status: Not on file  . Intimate partner violence    Fear of current or ex partner: Not on file    Emotionally abused: Not on file    Physically abused: Not on file  Forced sexual activity: Not on file  Other Topics Concern  . Not on file  Social History Narrative  . Not on file    Pertinent  Health Maintenance Due  Topic Date Due  . FOOT EXAM  08/30/1949  . OPHTHALMOLOGY EXAM  08/30/1949  . DEXA SCAN  08/30/2004  . HEMOGLOBIN A1C  12/16/2018  . PNA vac Low Risk Adult (2 of 2 - PCV13) 11/16/2019  . INFLUENZA VACCINE  Completed    Medications: Allergies as of 11/27/2018      Reactions   Penicillins       Medication List       Accurate as of November 27, 2018 11:59 PM. If you have any questions, ask your nurse or doctor.        STOP taking these medications   blood glucose meter kit and supplies Stopped by: Inocencio Homes, MD   oxyCODONE 5 MG immediate release tablet Commonly known as:  Roxicodone Stopped by: Inocencio Homes, MD     TAKE these medications   acetaminophen 325 MG tablet Commonly known as: TYLENOL Take 650 mg by mouth every 6 (six) hours. Scheduled   albuterol 108 (90 Base) MCG/ACT inhaler Commonly known as: ProAir HFA Inhale 2 puffs into the lungs every 6 (six) hours as needed for wheezing or shortness of breath.   aspirin 81 MG chewable tablet Chew 81 mg by mouth 2 (two) times daily.   atorvastatin 40 MG tablet Commonly known as: LIPITOR Take 1 tablet (40 mg total) by mouth daily.   calcium carbonate 500 MG chewable tablet Commonly known as: TUMS - dosed in mg elemental calcium Chew 1 tablet by mouth daily.   clonazePAM 0.5 MG tablet Commonly known as: KLONOPIN Take 1 tablet (0.5 mg total) by mouth at bedtime.   dicyclomine 10 MG capsule Commonly known as: BENTYL Take 1 capsule (10 mg total) by mouth 4 (four) times daily -  before meals and at bedtime.   famotidine 20 MG tablet Commonly known as: PEPCID Take 1 tablet (20 mg total) by mouth 2 (two) times daily.   fluticasone 50 MCG/ACT nasal spray Commonly known as: FLONASE Place 1 spray into both nostrils daily as needed for rhinitis.   gabapentin 800 MG tablet Commonly known as: NEURONTIN Take 1 tablet (800 mg total) by mouth at bedtime.   ibuprofen 200 MG tablet Commonly known as: ADVIL Take 600 mg by mouth every 6 (six) hours as needed for mild pain. 3 tablets to = 600 mg   levocetirizine 5 MG tablet Commonly known as: XYZAL Take 1 tablet (5 mg total) by mouth every evening.   lisinopril 20 MG tablet Commonly known as: ZESTRIL Take 1 tablet (20 mg total) by mouth daily. What changed: See the new instructions. Changed by: Inocencio Homes, MD   Magnesium Citrate 125 MG Caps Take by mouth.   metFORMIN 500 MG tablet Commonly known as: GLUCOPHAGE Take 1 tablet (500 mg total) by mouth 3 (three) times daily. What changed: how much to take Changed by: Inocencio Homes, MD    metoprolol tartrate 50 MG tablet Commonly known as: LOPRESSOR Take 1 tablet (50 mg total) by mouth 2 (two) times daily. What changed: See the new instructions. Changed by: Inocencio Homes, MD   omeprazole 40 MG capsule Commonly known as: PRILOSEC Take 40 mg by mouth daily.   ondansetron 4 MG disintegrating tablet Commonly known as: Zofran ODT Take 1 tablet (4 mg total) by mouth every 8 (eight) hours as needed  for nausea or vomiting.   True Metrix Blood Glucose Test test strip Generic drug: glucose blood Use to check sugar twice a day. Dx code: E11.9   venlafaxine XR 150 MG 24 hr capsule Commonly known as: EFFEXOR-XR Take 2 capsules (300 mg total) by mouth daily.        Vitals:   11/27/18 1358  BP: 110/60  Pulse: 70  Resp: 18  Temp: 98 F (36.7 C)  TempSrc: Oral  SpO2: 98%  Weight: 159 lb (72.1 kg)  Height: 5' 1"  (1.549 m)   Body mass index is 30.04 kg/m.  Physical Exam  GENERAL APPEARANCE: Alert, conversant. No acute distress.  HEENT: Unremarkable. RESPIRATORY: Breathing is even, unlabored. Lung sounds are clear   CARDIOVASCULAR: Heart RRR no murmurs, rubs or gallops. No peripheral edema.  GASTROINTESTINAL: Abdomen is soft, non-tender, not distended w/ normal bowel sounds.  NEUROLOGIC: Cranial nerves 2-12 grossly intact. Moves all extremities   Labs reviewed: Basic Metabolic Panel: Recent Labs    02/21/18 1030 06/15/18 0929 08/20/18 1203 11/21/18  NA 137 140 136 139  K 4.7 4.6 5.1 4.9  CL 102 101 100  --   CO2 26 27 24   --   GLUCOSE 145* 152* 114*  --   BUN 24* 28* 34* 30*  CREATININE 1.14 1.53* 1.21* 1.0  CALCIUM 10.1 10.1 10.1  --    No results found for: [facility] Liver Function Tests: Recent Labs    02/21/18 1030 06/15/18 0929 08/20/18 1203  AST 17 20 15   ALT 14 16 11   ALKPHOS 79 78 91  BILITOT 0.4 0.5 0.4  PROT 7.2 7.8 7.6  ALBUMIN 4.5 4.6 4.9   Recent Labs    08/20/18 1203  LIPASE 71.0*   No results for input(s): AMMONIA in  the last 8760 hours. CBC: Recent Labs    02/21/18 1030 06/15/18 0929 08/20/18 1203 11/21/18  WBC 6.2 7.6 8.5 7.3  NEUTROABS 3.1 4.5 4.9 4  HGB 11.9* 12.8 12.4 11.6*  HCT 36.4 38.5 38.3 36  MCV 89.1 91.2 91.0  --   PLT 186.0 200.0 215.0 241   Lipid Recent Labs    02/21/18 1030  CHOL 186  HDL 45.60  LDLCALC 103*  TRIG 191.0*   Cardiac Enzymes: No results for input(s): CKTOTAL, CKMB, CKMBINDEX, TROPONINI in the last 8760 hours. BNP: No results for input(s): BNP in the last 8760 hours. CBG: No results for input(s): GLUCAP in the last 8760 hours.  Procedures and Imaging Studies During Stay: Dg Shoulder Left  Result Date: 11/30/2018 CLINICAL DATA:  Recent fall with left shoulder pain, initial encounter EXAM: LEFT SHOULDER - 2+ VIEW COMPARISON:  None. FINDINGS: Degenerative changes of the acromioclavicular joint are seen. No acute fracture or dislocation is noted. No soft tissue abnormality is noted. IMPRESSION: Degenerative change without acute abnormality. Electronically Signed   By: Inez Catalina M.D.   On: 11/30/2018 14:49   Dg Knee 3 View Right  Result Date: 11/30/2018 CLINICAL DATA:  Recent fall with right knee pain, initial encounter EXAM: RIGHT KNEE - 3 VIEW COMPARISON:  None. FINDINGS: No evidence of fracture, dislocation, or joint effusion. No evidence of arthropathy or other focal bone abnormality. Soft tissues are unremarkable. IMPRESSION: No acute abnormality noted. Electronically Signed   By: Inez Catalina M.D.   On: 11/30/2018 14:52    Assessment/Plan:   Closed nondisplaced fracture of lateral malleolus of left fibula with routine healing, subsequent encounter  Closed sleeve fracture of left patella with routine  healing  Closed displaced avulsion fracture of right talus with routine healing, subsequent encounter  Hypertension associated with diabetes ([place])  Type 2 diabetes mellitus without complication, without long-term current use of insulin ([place])   Diabetic peripheral neuropathy associated with type 2 diabetes mellitus ([place])  Hyperlipidemia associated with type 2 diabetes mellitus ([place])   Patient is being discharged with the following home health services:  OT/PT  Patient is being discharged with the following durable medical equipment: rolling walker, bedside commode   Patient has been advised to f/u with their PCP in 1-2 weeks to bring them up to date on their rehab stay.  Social services at facility was responsible for arranging this appointment.  Pt was provided with a 30 day supply of prescriptions for medications and refills must be obtained from their PCP.  For controlled substances, a more limited supply may be provided adequate until PCP appointment only.     Time spent > 30 minutes;> 50% of time with patient was spent reviewing records, labs, tests and studies, counseling and developing plan of care  Hennie Duos MD

## 2018-11-28 ENCOUNTER — Other Ambulatory Visit: Payer: Self-pay | Admitting: Internal Medicine

## 2018-11-28 MED ORDER — GABAPENTIN 800 MG PO TABS: 800.0000 mg | ORAL_TABLET | Freq: Every day | ORAL | 0 refills | Status: DC

## 2018-11-28 MED ORDER — METFORMIN HCL 500 MG PO TABS: 500.0000 mg | ORAL_TABLET | Freq: Three times a day (TID) | ORAL | 0 refills | Status: AC

## 2018-11-28 MED ORDER — VENLAFAXINE HCL ER 150 MG PO CP24: 300.0000 mg | ORAL_CAPSULE | Freq: Every day | ORAL | 0 refills | Status: DC

## 2018-11-28 MED ORDER — ONDANSETRON 4 MG PO TBDP: 4.0000 mg | ORAL_TABLET | Freq: Three times a day (TID) | ORAL | 0 refills | Status: AC | PRN

## 2018-11-28 MED ORDER — FLUTICASONE PROPIONATE 50 MCG/ACT NA SUSP: 1.0000 | Freq: Every day | NASAL | 0 refills | Status: AC | PRN

## 2018-11-28 MED ORDER — FAMOTIDINE 20 MG PO TABS: 20.0000 mg | ORAL_TABLET | Freq: Two times a day (BID) | ORAL | 0 refills | Status: AC

## 2018-11-28 MED ORDER — DICYCLOMINE HCL 10 MG PO CAPS: 10.0000 mg | ORAL_CAPSULE | Freq: Three times a day (TID) | ORAL | 0 refills | Status: AC

## 2018-11-28 MED ORDER — ALBUTEROL SULFATE HFA 108 (90 BASE) MCG/ACT IN AERS: 2.0000 | INHALATION_SPRAY | Freq: Four times a day (QID) | RESPIRATORY_TRACT | 0 refills | Status: AC | PRN

## 2018-11-28 MED ORDER — METOPROLOL TARTRATE 50 MG PO TABS: 50.0000 mg | ORAL_TABLET | Freq: Two times a day (BID) | ORAL | 0 refills | Status: DC

## 2018-11-28 MED ORDER — CLONAZEPAM 0.5 MG PO TABS: 0.5000 mg | ORAL_TABLET | Freq: Every day | ORAL | 0 refills | Status: DC

## 2018-11-28 MED ORDER — ATORVASTATIN CALCIUM 40 MG PO TABS: 40.0000 mg | ORAL_TABLET | Freq: Every day | ORAL | 0 refills | Status: DC

## 2018-11-28 MED ORDER — LISINOPRIL 20 MG PO TABS: 20.0000 mg | ORAL_TABLET | Freq: Every day | ORAL | 0 refills | Status: AC

## 2018-11-30 ENCOUNTER — Ambulatory Visit (HOSPITAL_BASED_OUTPATIENT_CLINIC_OR_DEPARTMENT_OTHER)
Admission: RE | Admit: 2018-11-30 | Discharge: 2018-11-30 | Disposition: A | Discharge status: Home / Self Care | Payer: Medicare Other | Source: Ambulatory Visit | Attending: Medical | Admitting: Medical

## 2018-11-30 ENCOUNTER — Other Ambulatory Visit: Payer: Self-pay

## 2018-11-30 ENCOUNTER — Encounter: Payer: Self-pay | Admitting: Medical

## 2018-11-30 ENCOUNTER — Ambulatory Visit (INDEPENDENT_AMBULATORY_CARE_PROVIDER_SITE_OTHER): Payer: Medicare Other | Admitting: Medical

## 2018-11-30 VITALS — BP 133/47 | HR 97 | Temp 95.7°F | Resp 16 | Ht 61.0 in | Wt 156.4 lb

## 2018-11-30 DIAGNOSIS — M25561 Pain in right knee: Secondary | ICD-10-CM | POA: Insufficient documentation

## 2018-11-30 DIAGNOSIS — M25512 Pain in left shoulder: Secondary | ICD-10-CM | POA: Diagnosis not present

## 2018-11-30 DIAGNOSIS — S92109D Unspecified fracture of unspecified talus, subsequent encounter for fracture with routine healing: Secondary | ICD-10-CM

## 2018-11-30 DIAGNOSIS — R110 Nausea: Secondary | ICD-10-CM | POA: Diagnosis not present

## 2018-11-30 DIAGNOSIS — E2839 Other primary ovarian failure: Secondary | ICD-10-CM

## 2018-11-30 DIAGNOSIS — M79671 Pain in right foot: Secondary | ICD-10-CM

## 2018-11-30 DIAGNOSIS — S92109A Unspecified fracture of unspecified talus, initial encounter for closed fracture: Secondary | ICD-10-CM

## 2018-11-30 DIAGNOSIS — G8929 Other chronic pain: Secondary | ICD-10-CM | POA: Diagnosis present

## 2018-11-30 DIAGNOSIS — Z13820 Encounter for screening for osteoporosis: Secondary | ICD-10-CM

## 2018-11-30 MED ORDER — ONDANSETRON 4 MG PO TBDP: 4.0000 mg | ORAL_TABLET | Freq: Three times a day (TID) | ORAL | 0 refills | Status: AC | PRN

## 2018-11-30 NOTE — Patient Instructions (Signed)
For your history of bilateral talus fractures, you will continue to wear of the right side boot.  I do think is a good idea to go ahead and refer you to podiatrist to get opinion on when you can take the boot off and for reimaging 4 to 6weeks post fracture.  For your right knee pain, will get x-ray to evaluate the joint space.  Some of your pain might be degenerative joint type pain.  If x-ray is negative and your pain persist then might refer to sports medicine.  For history of chronic left shoulder pain, I will get x-ray and make decision on potential treatment or referral.  For history of chronic nausea, did refill your Zofran.  Follow-up date to be determined after imaging review.

## 2018-11-30 NOTE — Progress Notes (Signed)
Subjective:    Patient ID: Crystal Mckenzie, female    DOB: Mar 19, 1940, 78 y.o.   MRN: [id number]   HPI    Here for follow up after fall and fracture on 11-10-2018. Larey Seat that day carrying her dog. She tripped on streps doing up stairs.   Diagnoses  Hx of below.  Closed nondisplaced avulsion fracture of left talus, initial encounter  Closed nondisplaced avulsion fracture of right talus, initial encounter  Acute pain of left knee  Acute bilateral ankle pain  Unspecified fracture of right talus, initial encounter for closed fracture     Hospital Course: Crystal Mckenzie is a 78 year old Caucasian woman who fell at home and suffered several fractures simultaneously: her left patella, right talus, and left ankle lateral malleolus. This affected weight-bearing joints on both legs, and she subsequently went from being fully functional at home to being non-ambulatory and suffering a great deal of pain due to her acute trauma. She was hospitalized and placed on narcotic analgesics, and arrangements were made for short term rehabilitation. Her insurance has approved her for SNF rehabilitation. Covid test (for placement purposes) is negative.    Pt has not seen a podiatrist post ED visit or in rehab facility.  Hospital did arrange for rehab centee.   Pt has new pain rt knee after fall. Pain in her rt knee about level 2.   Some mild rt side sciatic area pain over past 2 weeks. No uti type signs or symptoms.    Hx of left shoulder pain before fall for months. Pain is mild.   Pt has hx of chronic nausea and uses zofran in the past. But no abdomen pain. No vomiting. No uti symptoms reported. No diarrhea. In the past I gave zofran and she has used intermittently.    Review of Systems  Constitutional: Negative for chills, fatigue and fever.  Respiratory: Negative for cough, chest tightness, shortness of breath and wheezing.   Cardiovascular: Negative for chest pain and palpitations.   Gastrointestinal: Negative for abdominal pain.  Musculoskeletal: Negative for back pain and gait problem.       See hpi.  Hematological: Negative for adenopathy. Does not bruise/bleed easily.  Psychiatric/Behavioral: Negative for behavioral problems, decreased concentration and dysphoric mood.    Past Medical History:  Diagnosis Date  . Anxiety   . Depression   . Diabetes ([facility])   . Diabetes mellitus without complication ([facility])   . GERD (gastroesophageal reflux disease)   . Hyperlipidemia   . Hypertension   . Urine incontinence      Social History   Socioeconomic History  . Marital status: Widowed    Spouse name: Not on file  . Number of children: Not on file  . Years of education: Not on file  . Highest education level: Not on file  Occupational History  . Not on file  Social Needs  . Financial resource strain: Not on file  . Food insecurity    Worry: Not on file    Inability: Not on file  . Transportation needs    Medical: Not on file    Non-medical: Not on file  Tobacco Use  . Smoking status: Former Smoker    Packs/day: 2.00    Years: 15.00    Pack years: 30.00    Types: Cigarettes    Quit date: 02/21/1987    Years since quitting: 31.7  . Smokeless tobacco: Never Used  Substance and Sexual Activity  . Alcohol use: Not Currently  Frequency: Never  . Drug use: Never  . Sexual activity: Not Currently  Lifestyle  . Physical activity    Days per week: Not on file    Minutes per session: Not on file  . Stress: Not on file  Relationships  . Social Herbalist on phone: Not on file    Gets together: Not on file    Attends religious service: Not on file    Active member of club or organization: Not on file    Attends meetings of clubs or organizations: Not on file    Relationship status: Not on file  . Intimate partner violence    Fear of current or ex partner: Not on file    Emotionally abused: Not on file    Physically abused: Not on file     Forced sexual activity: Not on file  Other Topics Concern  . Not on file  Social History Narrative  . Not on file    Past Surgical History:  Procedure Laterality Date  . NECK SURGERY      Family History  Problem Relation Age of Onset  . Diabetes Mother   . Heart disease Mother   . COPD Father     Allergies  Allergen Reactions  . Penicillins     Current Outpatient Medications on File Prior to Visit  Medication Sig Dispense Refill  . acetaminophen (TYLENOL) 325 MG tablet Take 650 mg by mouth every 6 (six) hours. Scheduled    . albuterol (PROAIR HFA) 108 (90 Base) MCG/ACT inhaler Inhale 2 puffs into the lungs every 6 (six) hours as needed for wheezing or shortness of breath. 18 g 0  . aspirin 81 MG chewable tablet Chew 81 mg by mouth 2 (two) times daily.    Marland Kitchen atorvastatin (LIPITOR) 40 MG tablet Take 1 tablet (40 mg total) by mouth daily. 30 tablet 0  . calcium carbonate (TUMS - DOSED IN MG ELEMENTAL CALCIUM) 500 MG chewable tablet Chew 1 tablet by mouth daily.    . clonazePAM (KLONOPIN) 0.5 MG tablet Take 1 tablet (0.5 mg total) by mouth at bedtime. 7 tablet 0  . dicyclomine (BENTYL) 10 MG capsule Take 1 capsule (10 mg total) by mouth 4 (four) times daily -  before meals and at bedtime. 120 capsule 0  . famotidine (PEPCID) 20 MG tablet Take 1 tablet (20 mg total) by mouth 2 (two) times daily. 60 tablet 0  . fluticasone (FLONASE) 50 MCG/ACT nasal spray Place 1 spray into both nostrils daily as needed for rhinitis. 9.9 mL 0  . gabapentin (NEURONTIN) 800 MG tablet Take 1 tablet (800 mg total) by mouth at bedtime. 30 tablet 0  . glucose blood (TRUE METRIX BLOOD GLUCOSE TEST) test strip Use to check sugar twice a day. Dx code: E11.9 100 each 1  . ibuprofen (ADVIL) 200 MG tablet Take 600 mg by mouth every 6 (six) hours as needed for mild pain. 3 tablets to = 600 mg    . levocetirizine (XYZAL) 5 MG tablet Take 1 tablet (5 mg total) by mouth every evening. 90 tablet 0  . lisinopril  (ZESTRIL) 20 MG tablet Take 1 tablet (20 mg total) by mouth daily. 30 tablet 0  . Magnesium Citrate 125 MG CAPS Take by mouth.    . metFORMIN (GLUCOPHAGE) 500 MG tablet Take 1 tablet (500 mg total) by mouth 3 (three) times daily. 90 tablet 0  . metoprolol tartrate (LOPRESSOR) 50 MG tablet Take 1 tablet (50  mg total) by mouth 2 (two) times daily. 60 tablet 0  . omeprazole (PRILOSEC) 40 MG capsule Take 40 mg by mouth daily.    . ondansetron (ZOFRAN ODT) 4 MG disintegrating tablet Take 1 tablet (4 mg total) by mouth every 8 (eight) hours as needed for nausea or vomiting. 20 tablet 0  . venlafaxine XR (EFFEXOR-XR) 150 MG 24 hr capsule Take 2 capsules (300 mg total) by mouth daily. 60 capsule 0   No current facility-administered medications on file prior to visit.     BP (!) 133/47   Pulse 97   Temp (!) 95.7 F (35.4 C) (Temporal)   Resp 16   Ht 5\' 1"  (1.549 m)   Wt 156 lb 6.4 oz (70.9 kg)   SpO2 98%   BMI 29.55 kg/m       Objective:   Physical Exam  General- No acute distress. Pleasant patient. Neck- Full range of motion, no jvd Lungs- Clear, even and unlabored. Heart- regular rate and rhythm. Neurologic- CNII- XII grossly intact.  Rt knee- faint crepitus on flexion and extension. Good rom. Left shoulder- good rom. Anterior aspect tenderness over bicep tendon.        Assessment & Plan:  For your history of bilateral talus fractures, you will continue to wear of the right side boot.  I do think is a good idea to go ahead and refer you to podiatrist to get opinion on when you can take the boot off and for reimaging 4 to 6weeks post fracture.  For your right knee pain, will get x-ray to evaluate the joint space.  Some of your pain might be degenerative joint type pain.  If x-ray is negative and your pain persist then might refer to sports medicine.  For history of chronic left shoulder pain, I will get x-ray and make decision on potential treatment or referral.  For history of  chronic nausea, did refill your Zofran.  Follow-up date to be determined after imaging review.  25 minutes spent with pt. 50% of time spent counseling pt on plan going forward.  Esperanza RichtersEdward Harve Spradley, PA-C

## 2018-12-01 ENCOUNTER — Encounter: Payer: Self-pay | Admitting: Internal Medicine

## 2018-12-04 ENCOUNTER — Telehealth: Payer: Self-pay | Admitting: Medical

## 2018-12-04 ENCOUNTER — Other Ambulatory Visit: Payer: Self-pay

## 2018-12-04 ENCOUNTER — Ambulatory Visit (HOSPITAL_BASED_OUTPATIENT_CLINIC_OR_DEPARTMENT_OTHER)
Admission: RE | Admit: 2018-12-04 | Discharge: 2018-12-04 | Disposition: A | Discharge status: Home / Self Care | Payer: Medicare Other | Source: Ambulatory Visit | Attending: Medical | Admitting: Medical

## 2018-12-04 DIAGNOSIS — S92109A Unspecified fracture of unspecified talus, initial encounter for closed fracture: Secondary | ICD-10-CM | POA: Insufficient documentation

## 2018-12-04 DIAGNOSIS — E2839 Other primary ovarian failure: Secondary | ICD-10-CM | POA: Insufficient documentation

## 2018-12-04 DIAGNOSIS — Z13820 Encounter for screening for osteoporosis: Secondary | ICD-10-CM | POA: Insufficient documentation

## 2018-12-04 MED ORDER — ALENDRONATE SODIUM 10 MG PO TABS: 10.0000 mg | ORAL_TABLET | Freq: Every day | ORAL | 11 refills | Status: AC

## 2018-12-04 NOTE — Telephone Encounter (Signed)
rx fosamax sent to pt pharmacy.

## 2018-12-06 ENCOUNTER — Ambulatory Visit: Payer: Medicare Other | Admitting: Medical

## 2018-12-17 ENCOUNTER — Other Ambulatory Visit: Payer: Self-pay | Admitting: Internal Medicine

## 2018-12-18 ENCOUNTER — Other Ambulatory Visit: Payer: Self-pay | Admitting: Internal Medicine

## 2018-12-21 ENCOUNTER — Telehealth: Payer: Self-pay | Admitting: Medical

## 2018-12-21 NOTE — Telephone Encounter (Signed)
Copied from [place] [phone]. Topic: General - Other >> Dec 21, 2018  2:56 PM Keene Breath wrote: Reason for CRM: Patient called to ask why her medication refill request was denied.  Please call patient to explain why the refill was denied.  CB# [phone]

## 2018-12-21 NOTE — Telephone Encounter (Addendum)
Refill Request: Clonazepam   Last RX:11/28/18 7 tablets (from nursing home MD) Last OV:11/30/18 Next QD:[facility] scheduled UDS:03/14/18 CSC:03/14/18 CSR: 12/23/2018  Notified pt ii was not [place] that denied refill it was doctor at Nursing home she was in rehab at. Pt states provider said he would start her on 2 tablets a day as needed.   Rx refilled.  Mackie Pai, PA-C

## 2018-12-23 MED ORDER — CLONAZEPAM 0.5 MG PO TABS: 0.5000 mg | ORAL_TABLET | Freq: Every day | ORAL | 0 refills | Status: DC

## 2018-12-25 ENCOUNTER — Telehealth: Payer: Self-pay | Admitting: Medical

## 2018-12-25 NOTE — Telephone Encounter (Signed)
Home Health Verbal Orders - Caller/Agency: Caryl Pina from [facility] Number: [phone], [place] to leave a message Requesting OT/PT/Skilled Nursing/Social Work/Speech Therapy: Nurse for medication management Frequency: evaulation  Requesting OT/PT/Skilled Nursing/Social Work/Speech Therapy: Education officer, museum for [employer] resources Frequency: evaulation

## 2018-12-26 NOTE — Telephone Encounter (Signed)
Verbals orders given.  

## 2018-12-26 NOTE — Telephone Encounter (Signed)
Could authorize twice a week for 4 weeks PT, OT, and skilled nursing. For social work and speech. What do they suggest. I don't remember her having any speech issues on last exam/interview?

## 2019-02-22 ENCOUNTER — Other Ambulatory Visit: Payer: Self-pay

## 2019-02-22 ENCOUNTER — Encounter: Payer: Self-pay | Admitting: Medical

## 2019-02-22 ENCOUNTER — Other Ambulatory Visit: Payer: Self-pay | Admitting: Medical

## 2019-02-22 ENCOUNTER — Ambulatory Visit (INDEPENDENT_AMBULATORY_CARE_PROVIDER_SITE_OTHER): Payer: Medicare Other | Admitting: Medical

## 2019-02-22 VITALS — BP 155/92 | HR 79 | Ht 61.0 in | Wt 154.0 lb

## 2019-02-22 DIAGNOSIS — M25569 Pain in unspecified knee: Secondary | ICD-10-CM | POA: Diagnosis not present

## 2019-02-22 DIAGNOSIS — B0229 Other postherpetic nervous system involvement: Secondary | ICD-10-CM

## 2019-02-22 DIAGNOSIS — F419 Anxiety disorder, unspecified: Secondary | ICD-10-CM | POA: Diagnosis not present

## 2019-02-22 DIAGNOSIS — F329 Major depressive disorder, single episode, unspecified: Secondary | ICD-10-CM

## 2019-02-22 DIAGNOSIS — F32A Depression, unspecified: Secondary | ICD-10-CM

## 2019-02-22 MED ORDER — GABAPENTIN 800 MG PO TABS: 800.0000 mg | ORAL_TABLET | Freq: Every day | ORAL | 0 refills | Status: DC

## 2019-02-22 MED ORDER — CLONAZEPAM 0.5 MG PO TABS: 0.5000 mg | ORAL_TABLET | Freq: Every day | ORAL | 0 refills | Status: AC

## 2019-02-22 MED ORDER — ATORVASTATIN CALCIUM 40 MG PO TABS: 40.0000 mg | ORAL_TABLET | Freq: Every day | ORAL | 0 refills | Status: DC

## 2019-02-22 NOTE — Progress Notes (Addendum)
Subjective:    Patient ID: Crystal Mckenzie, female    DOB: 1940/07/26, 79 y.o.   MRN: [id number]  HPI  Virtual Visit via Telephone Note  I connected with Crystal Mckenzie on 02/25/19 at  2:20 PM EST by telephone and verified that I am speaking with the correct person using two identifiers.  Location: Patient: home  Provider: office.   I discussed the limitations, risks, security and privacy concerns of performing an evaluation and management service by telephone and the availability of in person appointments. I also discussed with the patient that there may be a patient responsible charge related to this service. The patient expressed understanding and agreed to proceed.      I discussed the assessment and treatment plan with the patient. The patient was provided an opportunity to ask questions and all were answered. The patient agreed with the plan and demonstrated an understanding of the instructions.   The patient was advised to call back or seek an in-person evaluation if the symptoms worsen or if the condition fails to improve as anticipated.     Esperanza Richters, PA-C  Pt in for follow up.  Pt states since last appointment with me she saw sports medicine Dr. Edmon Crape.  ASSESSMENT: 1. Internal derangement of right knee MR KNEE RIGHT WO CONTRAST  2. Acute pain of right knee traMADoL (ULTRAM) 50 mg tablet  3. Primary osteoarthritis of right knee   PROCEDURE:   ORDERS:   PLAN: I had a detailed discussion with [name]. [name] has arthritis in her right knee. But there could be an internal derangement in her right knee. I've ordered an MRI of her right knee without contrast to delineate soft tissue pathology. Further definitive management will be determined by the results of the MRI. She should continue taking her ibuprofen and Tylenol as needed for pain. She cannot tolerate the ice. I instructed her that if the heat makes her knee feel better, she may apply mild heat for 15 minutes  at a time, once or twice a day. She was warned, however, that more heat can make the knee more swollen. She will follow-up after MRI. She is encouraged to call for any questions or concerns.    Pt states recently she has some depression and some anxiety. With covid she states mood is worse. Pt has 80 yo family member that passed away from overdose. Other family member had severe medical problems. Pt has been on effexor 150 mg 2 tab po q day. Pt states she is very sad. No thoughts of harm to self or others. Pt seclude except for talking to niece who is also depressed.   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 I refilled gabapentin.    Review of Systems  Constitutional: Negative for chills, fatigue and fever.  Respiratory: Negative for chest tightness, shortness of breath and wheezing.   Cardiovascular: Negative for chest pain and palpitations.  Gastrointestinal: Negative for abdominal pain.  Musculoskeletal: Negative for back pain.  Skin: Negative for rash.  Neurological: Negative for dizziness, speech difficulty, weakness, light-headedness and numbness.  Hematological: Negative for adenopathy. Does not bruise/bleed easily.  Psychiatric/Behavioral: Positive for dysphoric mood. Negative for behavioral problems, confusion and suicidal ideas. The patient is nervous/anxious.     Past Medical History:  Diagnosis Date  . Anxiety   . Depression   . Diabetes (HCC)   . Diabetes mellitus without complication (HCC)   . GERD (gastroesophageal reflux disease)   . Hyperlipidemia   .  Hypertension   . Urine incontinence      Social History   Socioeconomic History  . Marital status: Widowed    Spouse name: Not on file  . Number of children: Not on file  . Years of education: Not on file  . Highest education level: Not on file  Occupational History  . Not on file  Tobacco Use  . Smoking status: Former Smoker    Packs/day: 2.00    Years: 15.00    Pack years: 30.00     Types: Cigarettes    Quit date: 02/21/1987    Years since quitting: 32.0  . Smokeless tobacco: Never Used  Substance and Sexual Activity  . Alcohol use: Not Currently  . Drug use: Never  . Sexual activity: Not Currently  Other Topics Concern  . Not on file  Social History Narrative  . Not on file   Social Determinants of Health   Financial Resource Strain:   . Difficulty of Paying Living Expenses: Not on file  Food Insecurity:   . Worried About Charity fundraiser in the Last Year: Not on file  . Ran Out of Food in the Last Year: Not on file  Transportation Needs:   . Lack of Transportation (Medical): Not on file  . Lack of Transportation (Non-Medical): Not on file  Physical Activity:   . Days of Exercise per Week: Not on file  . Minutes of Exercise per Session: Not on file  Stress:   . Feeling of Stress : Not on file  Social Connections:   . Frequency of Communication with Friends and Family: Not on file  . Frequency of Social Gatherings with Friends and Family: Not on file  . Attends Religious Services: Not on file  . Active Member of Clubs or Organizations: Not on file  . Attends Archivist Meetings: Not on file  . Marital Status: Not on file  Intimate Partner Violence:   . Fear of Current or Ex-Partner: Not on file  . Emotionally Abused: Not on file  . Physically Abused: Not on file  . Sexually Abused: Not on file    Past Surgical History:  Procedure Laterality Date  . NECK SURGERY      Family History  Problem Relation Age of Onset  . Diabetes Mother   . Heart disease Mother   . COPD Father     Allergies  Allergen Reactions  . Penicillins     Current Outpatient Medications on File Prior to Visit  Medication Sig Dispense Refill  . acetaminophen (TYLENOL) 325 MG tablet Take 650 mg by mouth every 6 (six) hours. Scheduled    . albuterol (PROAIR HFA) 108 (90 Base) MCG/ACT inhaler Inhale 2 puffs into the lungs every 6 (six) hours as needed for  wheezing or shortness of breath. 18 g 0  . alendronate (FOSAMAX) 10 MG tablet Take 1 tablet (10 mg total) by mouth daily before breakfast. Take with a full glass of water on an empty stomach. 30 tablet 11  . dicyclomine (BENTYL) 10 MG capsule Take 1 capsule (10 mg total) by mouth 4 (four) times daily -  before meals and at bedtime. 120 capsule 0  . famotidine (PEPCID) 20 MG tablet Take 1 tablet (20 mg total) by mouth 2 (two) times daily. 60 tablet 0  . fluticasone (FLONASE) 50 MCG/ACT nasal spray Place 1 spray into both nostrils daily as needed for rhinitis. 9.9 mL 0  . glucose blood (TRUE METRIX BLOOD GLUCOSE TEST)  test strip Use to check sugar twice a day. Dx code: E11.9 100 each 1  . ibuprofen (ADVIL) 200 MG tablet Take 600 mg by mouth every 6 (six) hours as needed for mild pain. 3 tablets to = 600 mg    . levocetirizine (XYZAL) 5 MG tablet Take 1 tablet (5 mg total) by mouth every evening. 90 tablet 0  . lisinopril (ZESTRIL) 20 MG tablet Take 1 tablet (20 mg total) by mouth daily. 30 tablet 0  . Magnesium Citrate 125 MG CAPS Take by mouth.    . metFORMIN (GLUCOPHAGE) 500 MG tablet Take 1 tablet (500 mg total) by mouth 3 (three) times daily. 90 tablet 0  . metoprolol tartrate (LOPRESSOR) 50 MG tablet Take 1 tablet (50 mg total) by mouth 2 (two) times daily. 60 tablet 0  . omeprazole (PRILOSEC) 40 MG capsule Take 40 mg by mouth daily.    . ondansetron (ZOFRAN ODT) 4 MG disintegrating tablet Take 1 tablet (4 mg total) by mouth every 8 (eight) hours as needed for nausea or vomiting. 20 tablet 0  . ondansetron (ZOFRAN ODT) 4 MG disintegrating tablet Take 1 tablet (4 mg total) by mouth every 8 (eight) hours as needed for nausea or vomiting. 20 tablet 0  . venlafaxine XR (EFFEXOR-XR) 150 MG 24 hr capsule Take 2 capsules (300 mg total) by mouth daily. 60 capsule 0   No current facility-administered medications on file prior to visit.    BP (!) 155/92 (BP Location: Left Arm, Patient Position: Sitting,  Cuff Size: Normal)   Pulse 79   Ht 5\' 1"  (1.549 m)   Wt 154 lb (69.9 kg)   SpO2 95%   BMI 29.10 kg/m       Objective:   Physical Exam  General- no acute distress, pleasant, alert and oriented. Normal speech.      Assessment & Plan:  For depression and anxiety, continue with effexor and clonazepam. Use clonazepam sparingly as you have done over past 2 months. Rx advisement. 30 tab refill today for benzo. Need to renew contract and give uds before next rx.  For depression will go ahead and refer to behavioral health. Hesitant to give more ssri type med since on such high dose effexor.  For neuropathy refilled your gabapentin.  For knee pain continue management recommendation by sports med.  Follow up in one month or as needed  30 minutes spent with pt. 50% of time spent counseling pt on plan going forward.  , PA-C

## 2019-02-22 NOTE — Patient Instructions (Signed)
For depression and anxiety, continue with effexor and clonazepam. Use clonazepam sparingly as you have done over past 2 months. Rx advisement. 30 tab refill today for benzo. Need to renew contract and give uds before next rx.  For depression will go ahead and refer to behavioral health. Hesitant to give more ssri type med since on such high dose effexor.  For neuropathy refilled your gabapentin.  For knee pain continue management recommendation by sports med.  Follow up in one month or as needed

## 2019-03-13 ENCOUNTER — Other Ambulatory Visit: Payer: Self-pay | Admitting: Internal Medicine

## 2019-03-18 ENCOUNTER — Telehealth: Payer: Self-pay | Admitting: Medical

## 2019-03-18 ENCOUNTER — Other Ambulatory Visit: Payer: Self-pay | Admitting: Medical

## 2019-03-18 MED ORDER — METOPROLOL TARTRATE 50 MG PO TABS: 50.0000 mg | ORAL_TABLET | Freq: Two times a day (BID) | ORAL | 0 refills | Status: DC

## 2019-03-18 MED ORDER — VENLAFAXINE HCL ER 150 MG PO CP24: 300.0000 mg | ORAL_CAPSULE | Freq: Every day | ORAL | 0 refills | Status: DC

## 2019-03-18 NOTE — Telephone Encounter (Signed)
Refill done.  

## 2019-03-18 NOTE — Telephone Encounter (Signed)
Medication:   venlafaxine XR (EFFEXOR-XR) 150 MG 24 hr capsule    metoprolol tartrate (LOPRESSOR) 50 MG tablet   Has the patient contacted their pharmacy? No.  Preferred Pharmacy (with phone number or street name):    [facility] [address] [place], [place] [address] [place] [phone]      Agent: Please be advised that RX refills may take up to 3 business days. We ask that you follow-up with your pharmacy.

## 2019-03-19 NOTE — Telephone Encounter (Signed)
Ok Thanks, °

## 2019-04-02 ENCOUNTER — Other Ambulatory Visit: Payer: Self-pay | Admitting: Medical

## 2019-04-10 ENCOUNTER — Ambulatory Visit: Payer: Medicare Other | Admitting: Medical

## 2019-04-10 ENCOUNTER — Other Ambulatory Visit: Payer: Self-pay

## 2019-04-16 ENCOUNTER — Other Ambulatory Visit: Payer: Self-pay | Admitting: Medical

## 2019-04-22 ENCOUNTER — Ambulatory Visit (INDEPENDENT_AMBULATORY_CARE_PROVIDER_SITE_OTHER): Payer: Medicare Other | Admitting: Medical

## 2019-04-22 ENCOUNTER — Other Ambulatory Visit: Payer: Self-pay

## 2019-04-22 VITALS — BP 149/76 | HR 85

## 2019-04-22 DIAGNOSIS — F419 Anxiety disorder, unspecified: Secondary | ICD-10-CM

## 2019-04-22 DIAGNOSIS — R350 Frequency of micturition: Secondary | ICD-10-CM

## 2019-04-22 DIAGNOSIS — R5383 Other fatigue: Secondary | ICD-10-CM

## 2019-04-22 DIAGNOSIS — F32A Depression, unspecified: Secondary | ICD-10-CM

## 2019-04-22 DIAGNOSIS — B0229 Other postherpetic nervous system involvement: Secondary | ICD-10-CM | POA: Diagnosis not present

## 2019-04-22 DIAGNOSIS — F329 Major depressive disorder, single episode, unspecified: Secondary | ICD-10-CM | POA: Diagnosis not present

## 2019-04-22 DIAGNOSIS — E119 Type 2 diabetes mellitus without complications: Secondary | ICD-10-CM | POA: Diagnosis not present

## 2019-04-22 MED ORDER — BUSPIRONE HCL 7.5 MG PO TABS: 7.5000 mg | ORAL_TABLET | Freq: Two times a day (BID) | ORAL | 0 refills | Status: AC

## 2019-04-22 NOTE — Patient Instructions (Signed)
Recent depression and anxiety increased. You have been on high dose effexor 2 tab daily for years but recent decreased so I want you to get back on former dose and see if this helps your mood. For anxiety, I am adding low dose buspar. If with above mood and anxiety not improved then will refer you to psychiatrist.   I want you to call number I gave you to see if you can get scheduled for covid vaccine. Hopefully you can get series then have more social interaction with others who are vaccinated.   For htn get back on both your bp meds.  For diabetes take metformin as advised and will check a1c later this week.  For fatigue get labs done to investigate causes. See those listed.  For frequent urination will get UA when in for labs.  Follow up in 3 weeks or as needed

## 2019-04-22 NOTE — Progress Notes (Signed)
   Subjective:    Patient ID: Crystal Mckenzie, female    DOB: 05-15-1940, 79 y.o.   MRN: [id number]  HPI  Virtual Visit via Telephone Note  I connected with Crystal Mckenzie on 04/22/19 at  2:40 PM EDT by telephone and verified that I am speaking with the correct person using two identifiers.  Location: Patient: home Provider: office   I discussed the limitations, risks, security and privacy concerns of performing an evaluation and management service by telephone and the availability of in person appointments. I also discussed with the patient that there may be a patient responsible charge related to this service. The patient expressed understanding and agreed to proceed.   History of Present Illness: Pt in for follow up.  Pt states she has been feeling more depressed recently.  She states moderate to severe. She states just wants to sleep. Pt states she has pet dog. Pt does admit to anxiety. She used to been clonazepam but no longer on clonazepam(states too complicted to be on and aware would need to give uds if on regular basis). Pt is on high dose effexor 150 mg 2 tab q day. She has been on high effexor for years.    Pt is diabetic and was not taking metformin as she should. Was only taking one tab a day.  Pt has htn and has only been taking metoprolol. She has not been taking lisinopril  Has been on gabapentin for neuropathy.   Pt has not gotten covid vaccine yet. Pt has minimal social interaction. Does have zoom meeting with her Jehovah witness group.  Pt feels like she is getting forgetful.   She stats fatigued but also not doing anything other than staying around house.  Some frequent urination.      Observations/Objective:  General- no acute distress, pleasant, alert and oriented.    Assessment and Plan: Recent depression and anxiety increased. You have been on high dose effexor 2 tab daily for years but recent decreased so I want you to get back on former dose and  see if this helps your mood. For anxiety, I am adding low dose buspar. If with above mood and anxiety not improved then will refer you to psychiatrist.   I want you to call number I gave you to see if you can get scheduled for covid vaccine. Hopefully you can get series then have more social interaction with others who are vaccinated.   For htn get back on both your bp meds.  For diabetes take metformin as advised and will check a1c later this week.  For fatigue get labs done to investigate causes. See those listed.  For frequent urination will get UA when in for labs.  Follow up in 3 weeks or as needed  Esperanza Richters, PA-C  Follow Up Instructions:    I discussed the assessment and treatment plan with the patient. The patient was provided an opportunity to ask questions and all were answered. The patient agreed with the plan and demonstrated an understanding of the instructions.   The patient was advised to call back or seek an in-person evaluation if the symptoms worsen or if the condition fails to improve as anticipated.  I provided 30 minutes of non-face-to-face time during this encounter.   Esperanza Richters, PA-C    Review of Systems     Objective:   Physical Exam        Assessment & Plan:

## 2019-04-23 ENCOUNTER — Other Ambulatory Visit: Payer: Self-pay

## 2019-04-24 ENCOUNTER — Other Ambulatory Visit (INDEPENDENT_AMBULATORY_CARE_PROVIDER_SITE_OTHER): Payer: Medicare Other

## 2019-04-24 DIAGNOSIS — R5383 Other fatigue: Secondary | ICD-10-CM | POA: Diagnosis not present

## 2019-04-24 DIAGNOSIS — E119 Type 2 diabetes mellitus without complications: Secondary | ICD-10-CM | POA: Diagnosis not present

## 2019-04-24 LAB — CBC WITH DIFFERENTIAL/PLATELET
Absolute Monocytes: 639 cells/uL (ref 200–950)
Basophils Absolute: 43 cells/uL (ref 0–200)
Basophils Relative: 0.6 %
Eosinophils Absolute: 369 cells/uL (ref 15–500)
Eosinophils Relative: 5.2 %
HCT: 37.8 % (ref 35.0–45.0)
Hemoglobin: 12.4 g/dL (ref 11.7–15.5)
Lymphs Abs: 2343 cells/uL (ref 850–3900)
MCH: 30 pg (ref 27.0–33.0)
MCHC: 32.8 g/dL (ref 32.0–36.0)
MCV: 91.5 fL (ref 80.0–100.0)
MPV: 10.7 fL (ref 7.5–12.5)
Monocytes Relative: 9 %
Neutro Abs: 3706 cells/uL (ref 1500–7800)
Neutrophils Relative %: 52.2 %
Platelets: 212 10*3/uL (ref 140–400)
RBC: 4.13 10*6/uL (ref 3.80–5.10)
RDW: 11.7 % (ref 11.0–15.0)
Total Lymphocyte: 33 %
WBC: 7.1 10*3/uL (ref 3.8–10.8)

## 2019-04-25 ENCOUNTER — Ambulatory Visit: Payer: Medicare Other | Attending: Internal Medicine

## 2019-04-25 DIAGNOSIS — Z23 Encounter for immunization: Secondary | ICD-10-CM

## 2019-04-25 LAB — COMPREHENSIVE METABOLIC PANEL
ALT: 15 U/L (ref 0–35)
AST: 20 U/L (ref 0–37)
Albumin: 4.3 g/dL (ref 3.5–5.2)
Alkaline Phosphatase: 80 U/L (ref 39–117)
BUN: 23 mg/dL (ref 6–23)
CO2: 29 mEq/L (ref 19–32)
Calcium: 9.9 mg/dL (ref 8.4–10.5)
Chloride: 102 mEq/L (ref 96–112)
Creatinine, Ser: 1.15 mg/dL (ref 0.40–1.20)
GFR: 45.44 mL/min — ABNORMAL LOW (ref >60.00)
Glucose, Bld: 115 mg/dL — ABNORMAL HIGH (ref 70–99)
Potassium: 4.9 mEq/L (ref 3.5–5.1)
Sodium: 139 mEq/L (ref 135–145)
Total Bilirubin: 0.5 mg/dL (ref 0.2–1.2)
Total Protein: 7.3 g/dL (ref 6.0–8.3)

## 2019-04-25 LAB — HEMOGLOBIN A1C: Hgb A1c MFr Bld: 6.8 % — ABNORMAL HIGH (ref 4.6–6.5)

## 2019-04-25 LAB — TSH: TSH: 1.66 u[IU]/mL (ref 0.35–4.50)

## 2019-04-25 NOTE — Progress Notes (Signed)
   Covid-19 Vaccination Clinic  Name:  Crystal Mckenzie    MRN: [id number] DOB: [birth year]  04/25/2019  Crystal Mckenzie was observed post Covid-19 immunization for 15 minutes without incident. She was provided with Vaccine Information Sheet and instruction to access the V-Safe system.   Crystal Mckenzie was instructed to call 911 with any severe reactions post vaccine: Marland Kitchen Difficulty breathing  . Swelling of face and throat  . A fast heartbeat  . A bad rash all over body  . Dizziness and weakness   Immunizations Administered    Name Date Dose VIS Date Route   Pfizer COVID-19 Vaccine 04/25/2019  9:40 AM 0.3 mL 01/18/2019 Intramuscular   Manufacturer: [employer], [employer]   Lot: [id number]   NDC: [address]-7

## 2019-05-20 ENCOUNTER — Ambulatory Visit: Payer: Self-pay

## 2019-05-25 ENCOUNTER — Other Ambulatory Visit: Payer: Self-pay | Admitting: Medical

## 2019-06-01 ENCOUNTER — Other Ambulatory Visit: Payer: Self-pay | Admitting: Medical

## 2019-06-04 ENCOUNTER — Other Ambulatory Visit: Payer: Self-pay | Admitting: Internal Medicine

## 2019-06-12 ENCOUNTER — Other Ambulatory Visit: Payer: Self-pay | Admitting: Internal Medicine

## 2019-07-06 ENCOUNTER — Other Ambulatory Visit: Payer: Self-pay | Admitting: Medical

## 2019-08-09 ENCOUNTER — Other Ambulatory Visit: Payer: Self-pay | Admitting: Medical

## 2019-10-27 IMAGING — CT CT ABDOMEN AND PELVIS WITH CONTRAST
2 of 5 series · 16 of 46 positions shown, 18 images · IV contrast (APPLIED)
Comparison: None.

CLINICAL DATA: Nausea and diarrhea for 1 year. Occasional vomiting.
History of irritable bowel syndrome.

EXAM:
CT ABDOMEN AND PELVIS WITH CONTRAST
TECHNIQUE: Multidetector CT imaging of the abdomen and pelvis was performed
using the standard protocol following bolus administration of
intravenous contrast.
CONTRAST:  80mL OMNIPAQUE IOHEXOL 300 MG/ML  SOLN

[Series 2: axial st · axial · 0.87mm/px · z∈[-520,-140]mm · 13 of 86 slices shown, 15 images]
[im 5/86  soft-tissue]
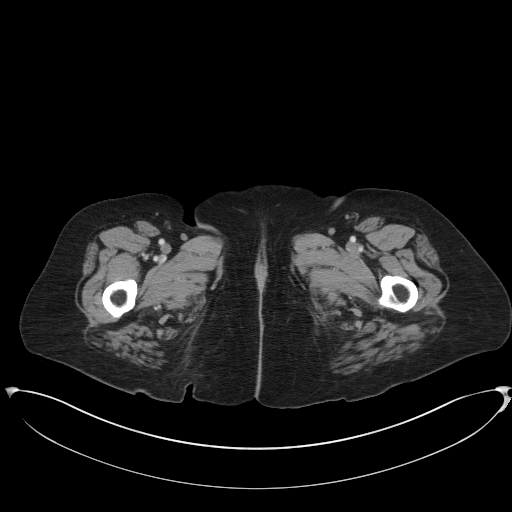
[im 5/86  bone]
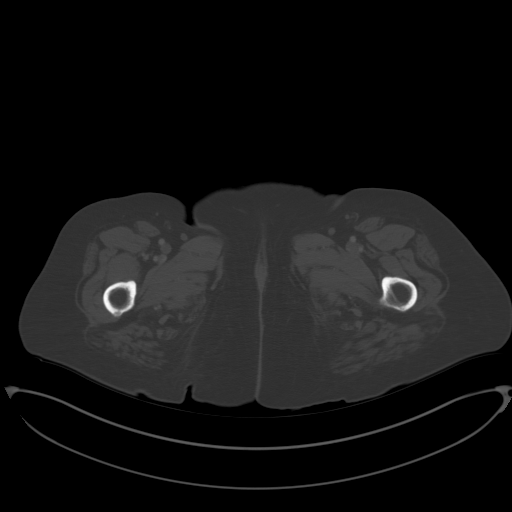
[im 14/86  soft-tissue]
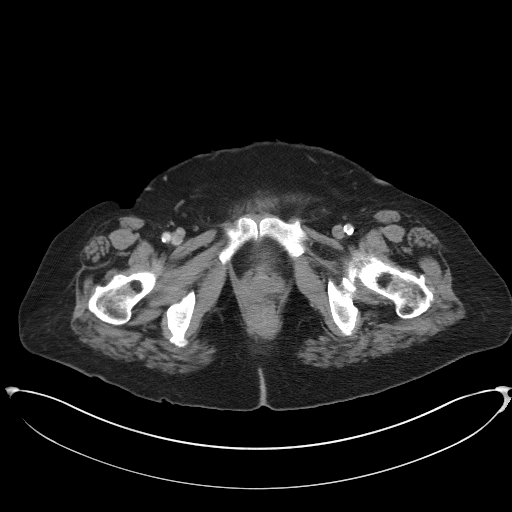
[im 18/86  soft-tissue]
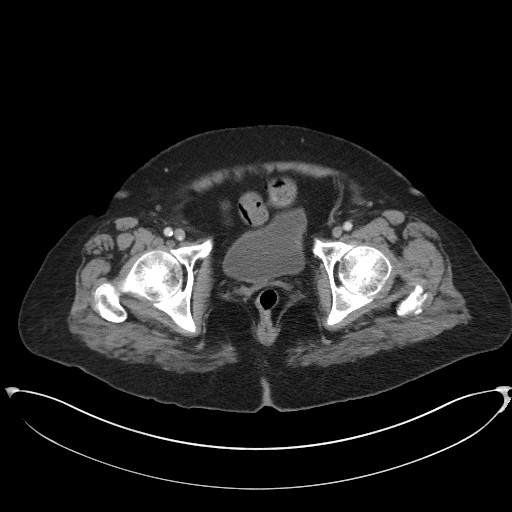
[im 23/86  soft-tissue]
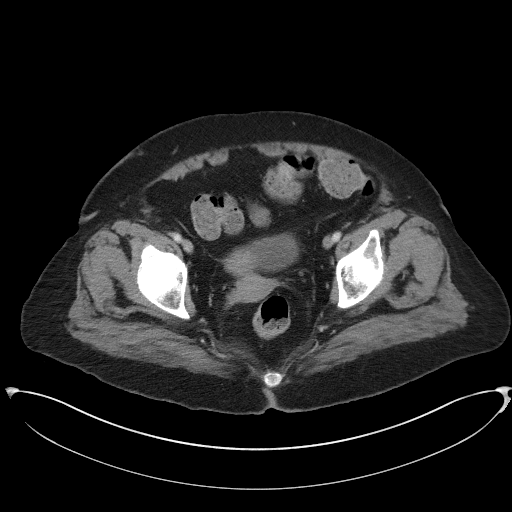
[im 32/86  soft-tissue]
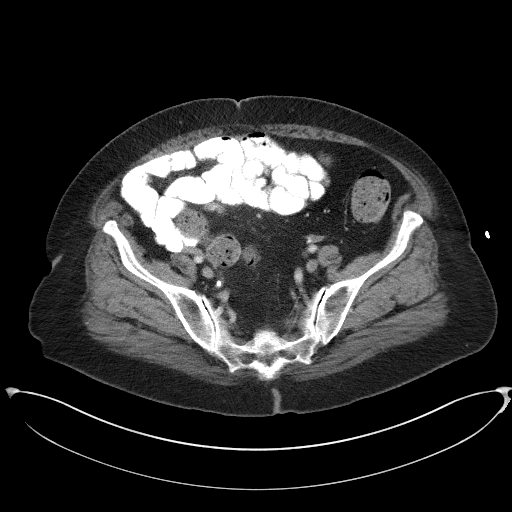
[im 36/86  soft-tissue]
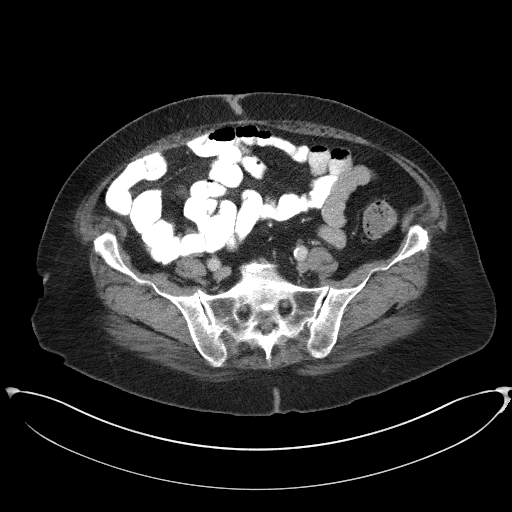
[im 45/86  soft-tissue]
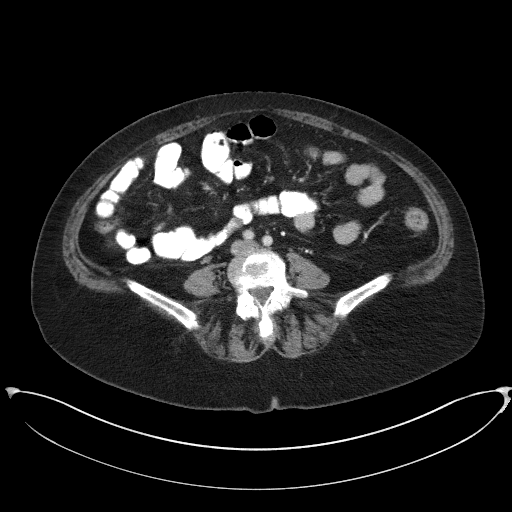
[im 50/86  soft-tissue]
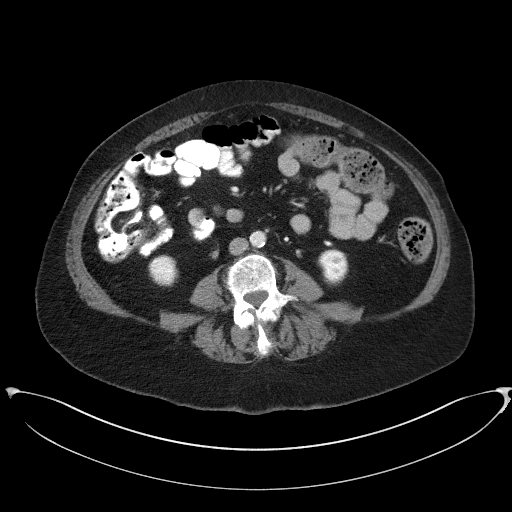
[im 54/86  soft-tissue]
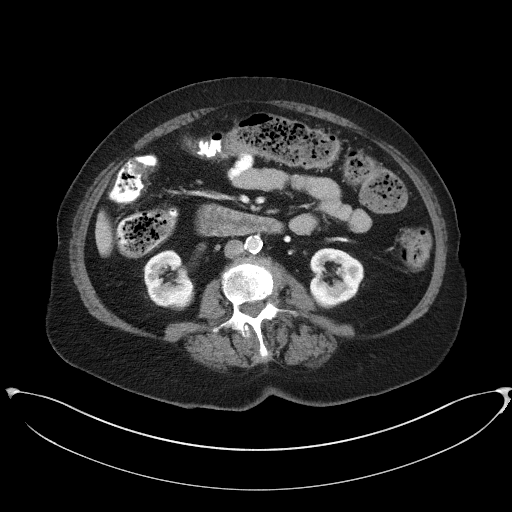
[im 54/86  bone]
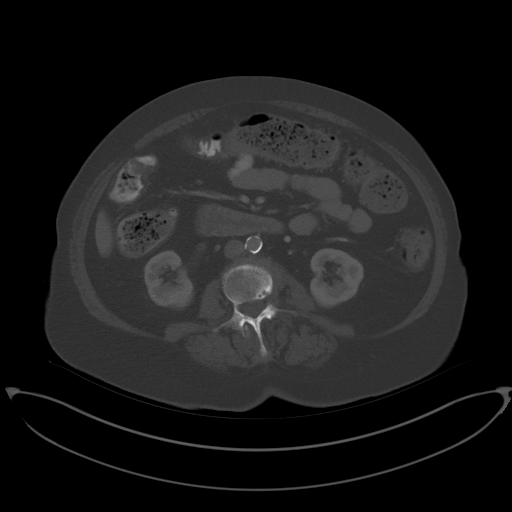
[im 63/86  soft-tissue]
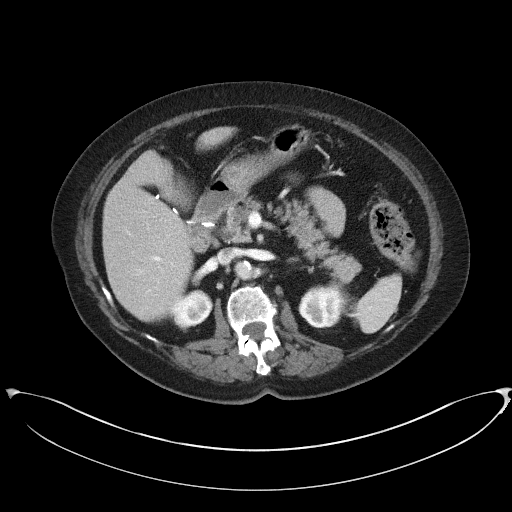
[im 68/86  soft-tissue]
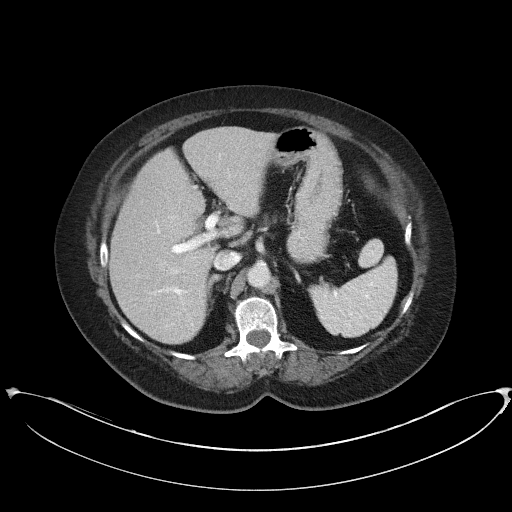
[im 72/86  soft-tissue]
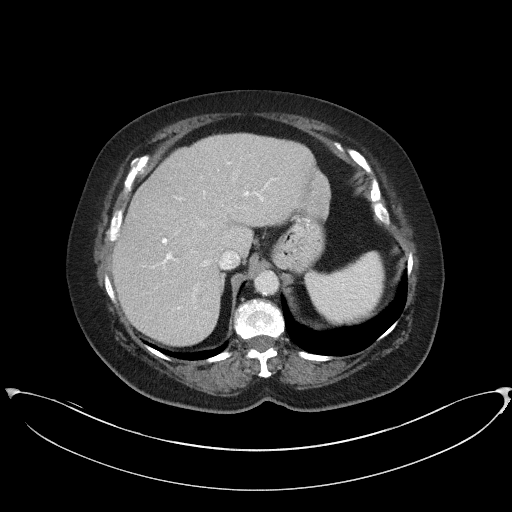
[im 81/86  soft-tissue]
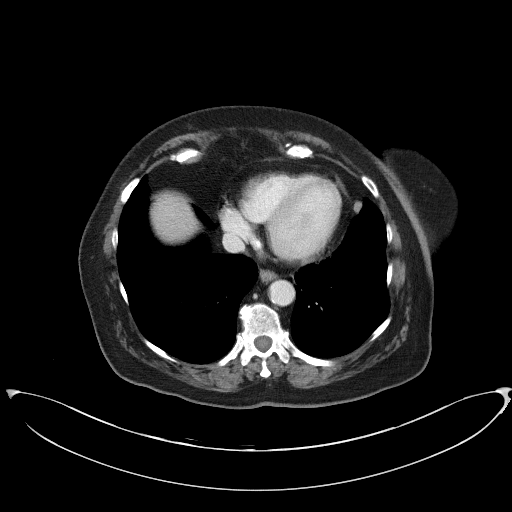

[Series 5: coronal st · coronal · 0.75mm/px · 3 of 98 slices shown]
[im 33/98  soft-tissue]
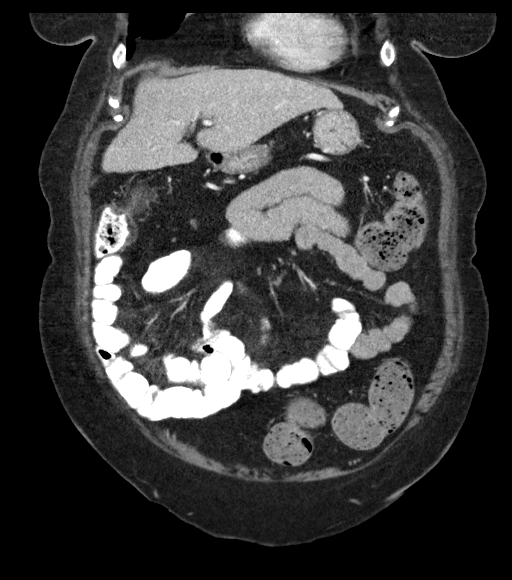
[im 44/98  soft-tissue]
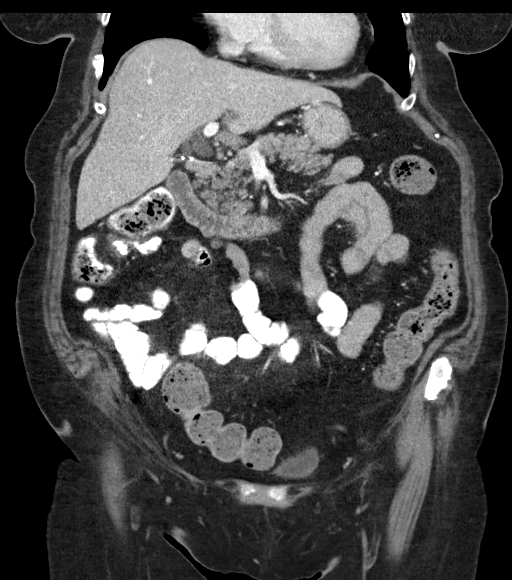
[im 54/98  soft-tissue]
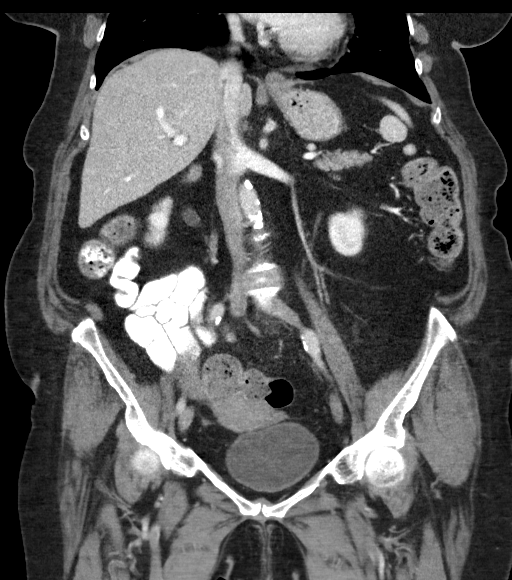

[16 of 46 positions shown; findings below may reference images not displayed]

FINDINGS: Lower chest: Clear lung bases.  Heart normal size.

Hepatobiliary: No focal liver abnormality is seen. Status post
cholecystectomy. No biliary dilatation.

Pancreas: Unremarkable. No pancreatic ductal dilatation or
surrounding inflammatory changes.

Spleen: Normal in size without focal abnormality.

Adrenals/Urinary Tract: No adrenal masses.

Mild bilateral renal cortical thinning. Kidneys normal in position
and orientation. Subcentimeter cortical low-density lesion, lateral
mid to upper pole the right kidney, consistent with a cyst. No other
renal masses or lesions, no stones and no hydronephrosis. Normal
ureters. Normal bladder.

Stomach/Bowel: Stomach is unremarkable. Small bowel and colon are
normal in caliber. No wall thickening. No inflammation. No appendix
visualized. No evidence of appendicitis.

Vascular/Lymphatic: Aortic atherosclerosis. No aneurysm. No enlarged
lymph nodes.

Reproductive: Uterus and bilateral adnexa are unremarkable.

Other: No abdominal wall hernia or abnormality. No abdominopelvic
ascites.

Musculoskeletal: No fracture or acute finding. No osteoblastic or
osteolytic lesions.
IMPRESSION: 1. No acute findings.  No evidence of bowel inflammation.
2. Status post cholecystectomy.
3. Aortic atherosclerosis.

## 2020-01-15 ENCOUNTER — Other Ambulatory Visit: Payer: Self-pay | Admitting: Medical

## 2021-04-14 ENCOUNTER — Encounter: Payer: Self-pay | Admitting: *Deleted
